# Patient Record
Sex: Male | Born: 2000 | Race: White | Hispanic: No | Marital: Single | Smoking: Never smoker
Health system: Southern US, Community
[De-identification: ages and names within clinical notes are randomized; demographics above are authoritative.]

## PROBLEM LIST (undated history)

## (undated) ENCOUNTER — Ambulatory Visit (HOSPITAL_COMMUNITY): Payer: No Payment, Other

## (undated) DIAGNOSIS — F909 Attention-deficit hyperactivity disorder, unspecified type: Secondary | ICD-10-CM

## (undated) HISTORY — PX: ORTHOPEDIC SURGERY: SHX850

---
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
## 2021-08-26 ENCOUNTER — Other Ambulatory Visit: Payer: Self-pay

## 2021-08-26 ENCOUNTER — Emergency Department (HOSPITAL_COMMUNITY)
Admission: EM | Admit: 2021-08-26 | Discharge: 2021-08-27 | Disposition: A | Discharge status: Home / Self Care | Payer: Medicaid Other | Attending: Emergency Medicine | Admitting: Emergency Medicine

## 2021-08-26 DIAGNOSIS — S61215A Laceration without foreign body of left ring finger without damage to nail, initial encounter: Secondary | ICD-10-CM

## 2021-08-26 DIAGNOSIS — W269XXA Contact with unspecified sharp object(s), initial encounter: Secondary | ICD-10-CM | POA: Insufficient documentation

## 2021-08-26 NOTE — ED Triage Notes (Signed)
Pt lacerated left middle finger while cutting cilantro about 2 pm today.  Noticed it was still oozing blood tonight and thought maybe he should come in.

## 2021-08-27 ENCOUNTER — Encounter (HOSPITAL_COMMUNITY): Payer: Self-pay | Admitting: Emergency Medicine

## 2021-08-27 NOTE — ED Provider Notes (Signed)
[facility]-EMERGENCY DEPT [facility] Emergency Department Provider Note MRN:  [id number]  Arrival date & time: 08/27/21     Chief Complaint   Laceration   History of Present Illness   Roberto Buchanan is a 21 y.o. year-old male presents to the ED with chief complaint of finger laceration.  States that he was cutting cilantro and cut his left ring finger.  He had difficulty controlling the bleeding and his mom told him to come to the ER.  History provided by patient.   Review of Systems  Pertinent review of systems noted in HPI.    Physical Exam   Vitals:   08/26/21 2357  BP: (!) 118/102  Pulse: 87  Resp: 18  Temp: 98.2 F (36.8 C)  SpO2: 100%    CONSTITUTIONAL:  well-appearing, NAD NEURO:  Alert and oriented x 3, CN 3-12 grossly intact EYES:  eyes equal and reactive ENT/NECK:  Supple, no stridor  CARDIO:  appears well-perfused  PULM:  No respiratory distress,  GI/GU:  non-distended,  MSK/SPINE:  No gross deformities, no edema, moves all extremities  SKIN:  no rash, very minor shallow laceration to left ring finger near the nail   *Additional and/or pertinent findings included in MDM below  Diagnostic and Interventional Summary    EKG Interpretation  Date/Time:    Ventricular Rate:    PR Interval:    QRS Duration:   QT Interval:    QTC Calculation:   R Axis:     Text Interpretation:         Labs Reviewed - No data to display  No orders to display    Medications - No data to display   Procedures  /  Critical Care .Marland KitchenLaceration Repair  Date/Time: 08/27/2021 1:08 AM Performed by: Roxy Horseman, PA-C Authorized by: Roxy Horseman, PA-C   Consent:    Consent obtained:  Verbal   Consent given by:  Patient   Risks discussed:  Infection, need for additional repair, pain, poor cosmetic result and poor wound healing   Alternatives discussed:  No treatment and delayed treatment Universal protocol:    Procedure explained and questions answered to  patient or proxy's satisfaction: yes     Relevant documents present and verified: yes     Test results available: yes     Imaging studies available: yes     Required blood products, implants, devices, and special equipment available: yes     Site/side marked: yes     Immediately prior to procedure, a time out was called: yes     Patient identity confirmed:  Verbally with patient Anesthesia:    Anesthesia method:  None Laceration details:    Location:  Finger   Finger location:  L ring finger   Length (cm):  1 Exploration:    Imaging outcome: foreign body not noted     Wound exploration: wound explored through full range of motion and entire depth of wound visualized     Wound extent: no tendon damage noted   Treatment:    Area cleansed with:  Saline Skin repair:    Repair method:  Tissue adhesive Approximation:    Approximation:  Close Repair type:    Repair type:  Simple Post-procedure details:    Dressing:  Open (no dressing)  ED Course and Medical Decision Making  I have reviewed the triage vital signs, the nursing notes, and pertinent available records from the EMR.  Social Determinants Affecting Complexity of Care: Patient has no clinically significant  social determinants affecting this chief complaint..   ED Course:   Patient here with finger laceration.   Medical Decision Making Problems Addressed: Laceration of left ring finger without foreign body without damage to nail, initial encounter: self-limited or minor problem     Consultants: No consultations were needed in caring for this patient.   Treatment and Plan: Emergency department workup does not suggest an emergent condition requiring admission or immediate intervention beyond  what has been performed at this time. The patient is safe for discharge and has  been instructed to return immediately for worsening symptoms, change in  symptoms or any other concerns    Final Clinical Impressions(s) / ED  Diagnoses     ICD-10-CM   1. Laceration of left ring finger without foreign body without damage to nail, initial encounter  E26.834H       ED Discharge Orders     None         Discharge Instructions Discussed with and Provided to Patient:   Discharge Instructions   None      Roxy Horseman, PA-C 08/27/21 0109    Geoffery Lyons, MD 08/27/21 [phone]

## 2021-12-27 ENCOUNTER — Encounter (HOSPITAL_BASED_OUTPATIENT_CLINIC_OR_DEPARTMENT_OTHER): Payer: Self-pay | Admitting: Emergency Medicine

## 2021-12-27 ENCOUNTER — Emergency Department (HOSPITAL_BASED_OUTPATIENT_CLINIC_OR_DEPARTMENT_OTHER)
Admission: EM | Admit: 2021-12-27 | Discharge: 2021-12-27 | Disposition: A | Discharge status: Home / Self Care | Payer: Self-pay | Attending: Emergency Medicine | Admitting: Emergency Medicine

## 2021-12-27 ENCOUNTER — Other Ambulatory Visit: Payer: Self-pay

## 2021-12-27 DIAGNOSIS — J069 Acute upper respiratory infection, unspecified: Secondary | ICD-10-CM | POA: Insufficient documentation

## 2021-12-27 DIAGNOSIS — Z20822 Contact with and (suspected) exposure to covid-19: Secondary | ICD-10-CM | POA: Insufficient documentation

## 2021-12-27 LAB — GROUP A STREP BY PCR: Group A Strep by PCR: NOT DETECTED

## 2021-12-27 LAB — RESP PANEL BY RT-PCR (FLU A&B, COVID) ARPGX2
Influenza A by PCR: NEGATIVE
Influenza B by PCR: NEGATIVE
SARS Coronavirus 2 by RT PCR: NEGATIVE

## 2021-12-27 NOTE — ED Provider Notes (Signed)
[place] [facility] EMERGENCY DEPT Provider Note   CSN: [id number] Arrival date & time: 12/27/21  1907     History  Chief Complaint  Patient presents with   Nasal Congestion    Roberto Buchanan is a 21 y.o. male.  21 year old male presents today for evaluation of URI symptoms.  He siblings also present today.  All with similar symptoms.  Patient is overall well-appearing.  Tolerating p.o. intake without difficulty.  Denies fever.  The history is provided by the patient. No language interpreter was used.       Home Medications Prior to Admission medications   Medication Sig Start Date End Date Taking? Authorizing Provider  cloNIDine (CATAPRES) 0.1 MG tablet Take 0.1 mg by mouth at bedtime as needed (sleep).     [provider]  hydrocortisone 2.5 % cream Apply topically 3 (three) times daily. 08/24/17   Kristen Cardinal, NP  ibuprofen (ADVIL,MOTRIN) 600 MG tablet Take 1 tablet (600 mg total) every 6 (six) hours as needed by mouth for mild pain. 02/18/17   Adibe, Dannielle Huh, MD  lisdexamfetamine (VYVANSE) 40 MG capsule Take 40 mg by mouth every morning.     [provider]  naproxen (NAPROSYN) 500 MG tablet Take 1 tablet (500 mg total) by mouth 2 (two) times daily. 07/27/18   Louanne Skye, MD  Olopatadine HCl (PATADAY) 0.2 % SOLN Place 1 drop into both eyes daily as needed (allergies).     [provider]      Allergies    Other    Review of Systems   Review of Systems  Constitutional:  Negative for chills and fever.  HENT:  Positive for sore throat. Negative for congestion and rhinorrhea.   Respiratory:  Negative for shortness of breath.   Cardiovascular:  Negative for chest pain.  Neurological:  Negative for headaches.  All other systems reviewed and are negative.   Physical Exam Updated Vital Signs BP (!) 138/96 (BP Location: Right Arm)   Pulse 83   Temp (!) 97.5 F (36.4 C)   Resp 16   Ht 6\' 4"  (1.93 m)   Wt 95.3 kg   SpO2 99%    BMI 25.56 kg/m  Physical Exam Vitals and nursing note reviewed.  Constitutional:      General: He is not in acute distress.    Appearance: Normal appearance. He is not ill-appearing.  HENT:     Head: Normocephalic and atraumatic.     Right Ear: Tympanic membrane, ear canal and external ear normal. There is no impacted cerumen.     Left Ear: Tympanic membrane, ear canal and external ear normal. There is no impacted cerumen.     Nose: Nose normal.     Mouth/Throat:     Mouth: Mucous membranes are moist.     Pharynx: No posterior oropharyngeal erythema.  Eyes:     Extraocular Movements: Extraocular movements intact.     Conjunctiva/sclera: Conjunctivae normal.  Cardiovascular:     Rate and Rhythm: Normal rate and regular rhythm.  Pulmonary:     Effort: Pulmonary effort is normal. No respiratory distress.     Breath sounds: Normal breath sounds. No wheezing or rales.  Musculoskeletal:        General: No deformity. Normal range of motion.     Cervical back: Normal range of motion.  Skin:    Findings: No rash.  Neurological:     Mental Status: He is alert.     ED Results / Procedures /  Treatments   Labs (all labs ordered are listed, but only abnormal results are displayed) Labs Reviewed  RESP PANEL BY RT-PCR (FLU A&B, COVID) ARPGX2  GROUP A STREP BY PCR    EKG None  Radiology No results found.  Procedures Procedures    Medications Ordered in ED Medications - No data to display  ED Course/ Medical Decision Making/ A&P                           Medical Decision Making  21 year old male presents today with above-mentioned complaints.  Siblings with similar symptoms.  Who came into the emergency room for evaluation tonight.  Overall well-appearing.  COVID, flu, strep negative.  Likely viral upper respiratory infection.  Discussed symptomatic management including adequate hydration.   Final Clinical Impression(s) / ED Diagnoses Final diagnoses:  Viral URI with  cough    Rx / DC Orders ED Discharge Orders     None         Evlyn Courier, PA-C 12/27/21 2200    Ezequiel Essex, MD 12/27/21 2354

## 2021-12-27 NOTE — ED Triage Notes (Signed)
Pt c/o runny nose and sore throat since Sunday

## 2021-12-27 NOTE — Discharge Instructions (Signed)
Your exam today is overall reassuring.  Your COVID, flu, and strep were negative.  Continue to drink plenty of fluids.  Take Tylenol Motrin as you need to for fever control.  For any concerning symptoms return to the emergency room otherwise follow-up with your PCP.

## 2021-12-27 NOTE — ED Notes (Signed)
RN provided AVS using Teachback Method. Patient verbalizes understanding of Discharge Instructions. Opportunity for Questioning and Answers were provided by RN. Patient Discharged from ED ambulatory to home with Family.  

## 2022-01-22 ENCOUNTER — Other Ambulatory Visit: Payer: Self-pay

## 2022-01-22 ENCOUNTER — Emergency Department (HOSPITAL_BASED_OUTPATIENT_CLINIC_OR_DEPARTMENT_OTHER)
Admission: EM | Admit: 2022-01-22 | Discharge: 2022-01-22 | Disposition: A | Discharge status: Home / Self Care | Payer: Self-pay | Attending: Emergency Medicine | Admitting: Emergency Medicine

## 2022-01-22 ENCOUNTER — Encounter (HOSPITAL_BASED_OUTPATIENT_CLINIC_OR_DEPARTMENT_OTHER): Payer: Self-pay

## 2022-01-22 DIAGNOSIS — J020 Streptococcal pharyngitis: Secondary | ICD-10-CM | POA: Insufficient documentation

## 2022-01-22 DIAGNOSIS — Z1152 Encounter for screening for COVID-19: Secondary | ICD-10-CM | POA: Insufficient documentation

## 2022-01-22 DIAGNOSIS — Z79899 Other long term (current) drug therapy: Secondary | ICD-10-CM | POA: Insufficient documentation

## 2022-01-22 LAB — MONONUCLEOSIS SCREEN: Mono Screen: NEGATIVE

## 2022-01-22 LAB — RESP PANEL BY RT-PCR (FLU A&B, COVID) ARPGX2
Influenza A by PCR: NEGATIVE
Influenza B by PCR: NEGATIVE
SARS Coronavirus 2 by RT PCR: NEGATIVE

## 2022-01-22 LAB — GROUP A STREP BY PCR: Group A Strep by PCR: DETECTED — AB

## 2022-01-22 MED ORDER — PENICILLIN G BENZATHINE 1200000 UNIT/2ML IM SUSY
2.4000 10*6.[IU] | PREFILLED_SYRINGE | Freq: Once | INTRAMUSCULAR | Status: AC
  Administered 2022-01-22: 2.4 10*6.[IU] via INTRAMUSCULAR
  Filled 2022-01-22: qty 4

## 2022-01-22 NOTE — ED Provider Notes (Signed)
[facility] EMERGENCY DEPT Provider Note   CSN: [id number] Arrival date & time: 01/22/22  1910     History  Chief Complaint  Patient presents with   Sore Throat    Roberto Buchanan is a 21 y.o. male.  ray  The history is provided by the patient. No language interpreter was used.  Sore Throat This is a recurrent problem. The current episode started 2 days ago. The problem occurs constantly. The problem has been gradually worsening. Pertinent negatives include no chest pain. Nothing aggravates the symptoms. Nothing relieves the symptoms. He has tried nothing for the symptoms. The treatment provided no relief.       Home Medications Prior to Admission medications   Medication Sig Start Date End Date Taking? Authorizing Provider  cloNIDine (CATAPRES) 0.1 MG tablet Take 0.1 mg by mouth at bedtime as needed (sleep).     [provider]  hydrocortisone 2.5 % cream Apply topically 3 (three) times daily. 08/24/17   Kristen Cardinal, NP  ibuprofen (ADVIL,MOTRIN) 600 MG tablet Take 1 tablet (600 mg total) every 6 (six) hours as needed by mouth for mild pain. 02/18/17   Adibe, Dannielle Huh, MD  lisdexamfetamine (VYVANSE) 40 MG capsule Take 40 mg by mouth every morning.     [provider]  naproxen (NAPROSYN) 500 MG tablet Take 1 tablet (500 mg total) by mouth 2 (two) times daily. 07/27/18   Louanne Skye, MD  Olopatadine HCl (PATADAY) 0.2 % SOLN Place 1 drop into both eyes daily as needed (allergies).     [provider]      Allergies    Other    Review of Systems   Review of Systems  Cardiovascular:  Negative for chest pain.  All other systems reviewed and are negative.   Physical Exam Updated Vital Signs BP (!) 133/92 (BP Location: Right Arm)   Pulse 66   Temp 98.3 F (36.8 C) (Oral)   Resp 16   Ht 6\' 4"  (1.93 m)   Wt 95.3 kg   SpO2 99%   BMI 25.57 kg/m  Physical Exam Vitals and nursing note reviewed.  Constitutional:       Appearance: He is well-developed.  HENT:     Head: Normocephalic.     Mouth/Throat:     Pharynx: Pharyngeal swelling and posterior oropharyngeal erythema present.     Tonsils: 2+ on the right. 2+ on the left.  Eyes:     Conjunctiva/sclera: Conjunctivae normal.  Pulmonary:     Effort: Pulmonary effort is normal.  Abdominal:     General: There is no distension.  Musculoskeletal:        General: Normal range of motion.     Cervical back: Normal range of motion.  Neurological:     Mental Status: He is alert and oriented to person, place, and time.     ED Results / Procedures / Treatments   Labs (all labs ordered are listed, but only abnormal results are displayed) Labs Reviewed  GROUP A STREP BY PCR - Abnormal; Notable for the following components:      Result Value   Group A Strep by PCR DETECTED (*)    All other components within normal limits  RESP PANEL BY RT-PCR (FLU A&B, COVID) ARPGX2  MONONUCLEOSIS SCREEN    EKG None  Radiology No results found.  Procedures Procedures    Medications Ordered in ED Medications  penicillin g benzathine (BICILLIN LA) 1200000 UNIT/2ML injection 2.4 Million Units (2.4 Million  Units Intramuscular Given 01/22/22 2243)    ED Course/ Medical Decision Making/ A&P                           Medical Decision Making Pt complains of a sore throat.   Amount and/or Complexity of Data Reviewed Labs: ordered. Decision-making details documented in ED Course.    Details: Labs ordered reviewed and interpreted,  Strep is positive   Risk Prescription drug management. Risk Details: Pt request treatment with injection of pcn.             Final Clinical Impression(s) / ED Diagnoses Final diagnoses:  Strep pharyngitis    Rx / DC Orders ED Discharge Orders     None     An After Visit Summary was printed and given to the patient.     Elson Areas, PA-C 01/22/22 2322    Franne Forts, DO 01/24/22 1831

## 2022-01-22 NOTE — ED Triage Notes (Signed)
Patient here POV from Home  Endorses Sore Throat for approximately a few days. Some Cough noted.  NAD Noted during Triage. A&Ox4. GCS 15. Ambulatory.

## 2022-03-19 ENCOUNTER — Other Ambulatory Visit: Payer: Self-pay

## 2022-03-19 ENCOUNTER — Emergency Department (HOSPITAL_BASED_OUTPATIENT_CLINIC_OR_DEPARTMENT_OTHER)
Admission: EM | Admit: 2022-03-19 | Discharge: 2022-03-20 | Disposition: A | Discharge status: Other Acute Inpatient Hospital | Payer: Self-pay | Attending: Emergency Medicine | Admitting: Emergency Medicine

## 2022-03-19 ENCOUNTER — Encounter (HOSPITAL_BASED_OUTPATIENT_CLINIC_OR_DEPARTMENT_OTHER): Payer: Self-pay | Admitting: Emergency Medicine

## 2022-03-19 DIAGNOSIS — T1491XA Suicide attempt, initial encounter: Secondary | ICD-10-CM

## 2022-03-19 DIAGNOSIS — T50902A Poisoning by unspecified drugs, medicaments and biological substances, intentional self-harm, initial encounter: Secondary | ICD-10-CM | POA: Insufficient documentation

## 2022-03-19 DIAGNOSIS — Z1152 Encounter for screening for COVID-19: Secondary | ICD-10-CM | POA: Insufficient documentation

## 2022-03-19 LAB — CBC WITH DIFFERENTIAL/PLATELET
Abs Immature Granulocytes: 0.01 10*3/uL (ref 0.00–0.07)
Basophils Absolute: 0 10*3/uL (ref 0.0–0.1)
Basophils Relative: 0 %
Eosinophils Absolute: 0.1 10*3/uL (ref 0.0–0.5)
Eosinophils Relative: 3 %
HCT: 47.1 % (ref 39.0–52.0)
Hemoglobin: 16.3 g/dL (ref 13.0–17.0)
Immature Granulocytes: 0 %
Lymphocytes Relative: 30 %
Lymphs Abs: 1.5 10*3/uL (ref 0.7–4.0)
MCH: 29.7 pg (ref 26.0–34.0)
MCHC: 34.6 g/dL (ref 30.0–36.0)
MCV: 85.9 fL (ref 80.0–100.0)
Monocytes Absolute: 0.7 10*3/uL (ref 0.1–1.0)
Monocytes Relative: 14 %
Neutro Abs: 2.5 10*3/uL (ref 1.7–7.7)
Neutrophils Relative %: 53 %
Platelets: 232 10*3/uL (ref 150–400)
RBC: 5.48 MIL/uL (ref 4.22–5.81)
RDW: 13.1 % (ref 11.5–15.5)
WBC: 4.9 10*3/uL (ref 4.0–10.5)
nRBC: 0 % (ref 0.0–0.2)

## 2022-03-19 LAB — RESP PANEL BY RT-PCR (FLU A&B, COVID) ARPGX2
Influenza A by PCR: NEGATIVE
Influenza B by PCR: NEGATIVE
SARS Coronavirus 2 by RT PCR: NEGATIVE

## 2022-03-19 LAB — RAPID URINE DRUG SCREEN, HOSP PERFORMED
Amphetamines: NOT DETECTED
Barbiturates: NOT DETECTED
Benzodiazepines: NOT DETECTED
Cocaine: NOT DETECTED
Opiates: NOT DETECTED
Tetrahydrocannabinol: POSITIVE — AB

## 2022-03-19 LAB — ACETAMINOPHEN LEVEL: Acetaminophen (Tylenol), Serum: <10 ug/mL — ABNORMAL LOW (ref 10–30)

## 2022-03-19 LAB — COMPREHENSIVE METABOLIC PANEL
ALT: 13 U/L (ref 0–44)
AST: 20 U/L (ref 15–41)
Albumin: 4.3 g/dL (ref 3.5–5.0)
Alkaline Phosphatase: 62 U/L (ref 38–126)
Anion gap: 6 (ref 5–15)
BUN: 8 mg/dL (ref 6–20)
CO2: 26 mmol/L (ref 22–32)
Calcium: 9.1 mg/dL (ref 8.9–10.3)
Chloride: 105 mmol/L (ref 98–111)
Creatinine, Ser: 1.02 mg/dL (ref 0.61–1.24)
GFR, Estimated: >60 mL/min (ref >60)
Glucose, Bld: 101 mg/dL — ABNORMAL HIGH (ref 70–99)
Potassium: 3.9 mmol/L (ref 3.5–5.1)
Sodium: 137 mmol/L (ref 135–145)
Total Bilirubin: 0.6 mg/dL (ref 0.3–1.2)
Total Protein: 8.2 g/dL — ABNORMAL HIGH (ref 6.5–8.1)

## 2022-03-19 LAB — ETHANOL: Alcohol, Ethyl (B): <10 mg/dL (ref <10)

## 2022-03-19 LAB — SALICYLATE LEVEL: Salicylate Lvl: <7 mg/dL — ABNORMAL LOW (ref 7.0–30.0)

## 2022-03-19 NOTE — ED Notes (Signed)
Security took patient's belonging to safe : black iphone , silver watch , brown wallet zero cash ,silver ring and keys . Pt's clothes and shoes are in the cabinet above the nurse's station.

## 2022-03-19 NOTE — ED Triage Notes (Signed)
Pt arrives pov, steady gait, endorses SI, reports taking "bunch of pills that were in my car last night", denies knowing type of pills. Reports emesis multiple times. Pt has cut marks on LT arm, endorses being a "cutter". Pt with calm demeanor, cooperative

## 2022-03-19 NOTE — ED Provider Notes (Signed)
[facility] EMERGENCY DEPARTMENT Provider Note  CSN: [id number] Arrival date & time: 03/19/22 1757  Chief Complaint(s) Suicidal  HPI Roberto Buchanan is a 21 y.o. male who denies significant past medical history presenting to the emergency department with suicidal ideation.  He reports last night he felt suicidal and took unidentified pills that were in his car.  He is not sure what they are.  He reports may be some oxycodone and may be some Tylenol.  He reports that he felt woozy last night but now feels at baseline.  Denies any abdominal pain, nausea, vomiting, diarrhea, lightheadedness, dizziness, palpitations, chest pain, shortness of breath.  He reports that he does not still feel suicidal the extent of wanting to hurt himself but does feel depressed.  Has never done this before.  No prior psychiatric hospitalization.  Denies any drug or alcohol use.  Denies any homicidal ideation or hallucinations.  Past Medical History Past Medical History:  Diagnosis Date   Attention deficit hyperactivity disorder    Patient Active Problem List   Diagnosis Date Noted   Abdominal pain 02/17/2017   Home Medication(s) Prior to Admission medications   Medication Sig Start Date End Date Taking? Authorizing Provider  cloNIDine (CATAPRES) 0.1 MG tablet Take 0.1 mg by mouth at bedtime as needed (sleep).     [provider]  hydrocortisone 2.5 % cream Apply topically 3 (three) times daily. 08/24/17   Lowanda Foster, NP  ibuprofen (ADVIL,MOTRIN) 600 MG tablet Take 1 tablet (600 mg total) every 6 (six) hours as needed by mouth for mild pain. 02/18/17   Adibe, Felix Pacini, MD  lisdexamfetamine (VYVANSE) 40 MG capsule Take 40 mg by mouth every morning.     [provider]  naproxen (NAPROSYN) 500 MG tablet Take 1 tablet (500 mg total) by mouth 2 (two) times daily. 07/27/18   Niel Hummer, MD  Olopatadine HCl (PATADAY) 0.2 % SOLN Place 1 drop into both eyes daily as needed (allergies).      [provider]                                                                                                                                    Past Surgical History Past Surgical History:  Procedure Laterality Date   LAPAROSCOPIC APPENDECTOMY N/A 02/17/2017   Procedure: APPENDECTOMY LAPAROSCOPIC;  Surgeon: Kandice Hams, MD;  Location: [facility] OR;  Service: Pediatrics;  Laterality: N/A;   ORTHOPEDIC SURGERY     On Elbow    Family History Family History  Problem Relation Age of Onset   Heart disease Maternal Aunt    Drug abuse Maternal Aunt    Drug abuse Maternal Uncle     Social History Social History   Tobacco Use   Smoking status: Never   Smokeless tobacco: Never  Vaping Use   Vaping Use: Every day  Substance Use Topics   Alcohol use: Yes  Comment: occ   Allergies Other  Review of Systems Review of Systems  All other systems reviewed and are negative.   Physical Exam Vital Signs  I have reviewed the triage vital signs BP 116/76 (BP Location: Left Arm)   Pulse 73   Temp 98.6 F (37 C) (Oral)   Resp 16   Ht 6\' 3"  (1.905 m)   Wt 104.3 kg   SpO2 99%   BMI 28.75 kg/m  Physical Exam Vitals and nursing note reviewed.  Constitutional:      General: He is not in acute distress.    Appearance: Normal appearance.  HENT:     Mouth/Throat:     Mouth: Mucous membranes are moist.  Eyes:     Conjunctiva/sclera: Conjunctivae normal.  Cardiovascular:     Rate and Rhythm: Normal rate and regular rhythm.  Pulmonary:     Effort: Pulmonary effort is normal. No respiratory distress.     Breath sounds: Normal breath sounds.  Abdominal:     General: Abdomen is flat.     Palpations: Abdomen is soft.     Tenderness: There is no abdominal tenderness.  Musculoskeletal:     Right lower leg: No edema.     Left lower leg: No edema.  Skin:    General: Skin is warm and dry.     Capillary Refill: Capillary refill takes less than 2 seconds.     Comments:  Superficial healing cuts to both forearms  Neurological:     Mental Status: He is alert and oriented to person, place, and time. Mental status is at baseline.  Psychiatric:        Behavior: Behavior normal.     Comments: Dysphoric mood     ED Results and Treatments Labs (all labs ordered are listed, but only abnormal results are displayed) Labs Reviewed  COMPREHENSIVE METABOLIC PANEL - Abnormal; Notable for the following components:      Result Value   Glucose, Bld 101 (*)    Total Protein 8.2 (*)    All other components within normal limits  RAPID URINE DRUG SCREEN, HOSP PERFORMED - Abnormal; Notable for the following components:   Tetrahydrocannabinol POSITIVE (*)    All other components within normal limits  ACETAMINOPHEN LEVEL - Abnormal; Notable for the following components:   Acetaminophen (Tylenol), Serum <10 (*)    All other components within normal limits  SALICYLATE LEVEL - Abnormal; Notable for the following components:   Salicylate Lvl <7.0 (*)    All other components within normal limits  RESP PANEL BY RT-PCR (FLU A&B, COVID) ARPGX2  ETHANOL  CBC WITH DIFFERENTIAL/PLATELET                                                                                                                          Radiology No results found.  Pertinent labs & imaging results that were available during my care of the patient were reviewed by me and considered in my medical decision  making (see MDM for details).  Medications Ordered in ED Medications - No data to display                                                                                                                                   Procedures Procedures  (including critical care time)  Medical Decision Making / ED Course   MDM:  21 year old male after attempted overdose.  Patient well-appearing, physical exam unremarkable.  Although patient reports no active SI will consult psychiatry after patient medically  cleared given his attempt last night.  Low concern for dangerous result of his ingestion given that he is currently asymptomatic.  Will obtain labs including LFTs, Tylenol and salicylate level, ethanol level.  Will obtain EKG.  If medical workup unremarkable will place TTS consult  Clinical Course as of 03/19/22 2310  Mon Mar 19, 2022  2308 Psychiatry team recommends inpatient admission.  I placed patient on an involuntary commitment as he wanted to leave.  Since he attempted suicide last night I feel he is high risk to have another suicide attempt. [WS]    Clinical Course User Index [WS] Lonell GrandchildScheving, Roma Bondar L, MD       Additional history obtained: -External records from outside source obtained and reviewed including: Chart review including previous notes, labs, imaging, consultation notes including ED visit 01/22/22   Lab Tests: -I ordered, reviewed, and interpreted labs.   The pertinent results include:   Labs Reviewed  COMPREHENSIVE METABOLIC PANEL - Abnormal; Notable for the following components:      Result Value   Glucose, Bld 101 (*)    Total Protein 8.2 (*)    All other components within normal limits  RAPID URINE DRUG SCREEN, HOSP PERFORMED - Abnormal; Notable for the following components:   Tetrahydrocannabinol POSITIVE (*)    All other components within normal limits  ACETAMINOPHEN LEVEL - Abnormal; Notable for the following components:   Acetaminophen (Tylenol), Serum <10 (*)    All other components within normal limits  SALICYLATE LEVEL - Abnormal; Notable for the following components:   Salicylate Lvl <7.0 (*)    All other components within normal limits  RESP PANEL BY RT-PCR (FLU A&B, COVID) ARPGX2  ETHANOL  CBC WITH DIFFERENTIAL/PLATELET    Notable for positive THC. Negative tylenol/salicylate levels  EKG   EKG Interpretation  Date/Time:  Monday March 19 2022 19:29:33 EST Ventricular Rate:  70 PR Interval:  124 QRS Duration: 106 QT Interval:  394 QTC  Calculation: 426 R Axis:   79 Text Interpretation: Sinus rhythm RSR' in V1 or V2, probably normal variant Confirmed by Alvino BloodScheving, Newell Wafer ([id number]) on 03/19/2022 7:32:07 PM         Medicines ordered and prescription drug management: No orders of the defined types were placed in this encounter.   -I have reviewed the patients home medicines and have made adjustments as needed   Consultations Obtained: I requested consultation  with the psychiatrist,  and discussed lab and imaging findings as well as pertinent plan - they recommend: inpatient psychiatric admission   Cardiac Monitoring: The patient was maintained on a cardiac monitor.  I personally viewed and interpreted the cardiac monitored which showed an underlying rhythm of: NSR  Co morbidities that complicate the patient evaluation  Past Medical History:  Diagnosis Date   Attention deficit hyperactivity disorder       Dispostion: Disposition decision including need for hospitalization was considered, and patient will be psychiatric boarder for inpatient psychiatric hospitalization    Final Clinical Impression(s) / ED Diagnoses Final diagnoses:  Suicide attempt [facility])     This chart was dictated using voice recognition software.  Despite best efforts to proofread,  errors can occur which can change the documentation meaning.    Lonell Grandchild, MD 03/19/22 [phone]

## 2022-03-19 NOTE — ED Notes (Signed)
Roberto Buchanan [phone] Mother [id number] Roberto Buchanan [phone]

## 2022-03-19 NOTE — BH Assessment (Signed)
Comprehensive Clinical Assessment (CCA) Note  03/19/2022 Roberto Buchanan [id number]  Disposition: Roberto Hires, NP recommends inpatient psychiatric hospitalization due to suicide attempt. Dr. Alvino Buchanan made aware of recommendation. [facility] AC to review.  The patient demonstrates the following risk factors for suicide: Chronic risk factors for suicide include: N/A. Acute risk factors for suicide include:  financial stressors . Protective factors for this patient include: positive social support. Considering these factors, the overall suicide risk at this point appears to be high. Patient is not appropriate for outpatient follow up.  Flowsheet Row ED from 03/19/2022 in [facility] from 01/22/2022 in [facility] ED from 12/27/2021 in [facility]  C-SSRS RISK CATEGORY High Risk No Risk No Risk      Roberto Buchanan is a 21 year-old male who presents voluntarily to [employer] via personal vehicle. Pt reports "I took a bunch of medication last night." Pt also showed clinician superficial cuts to his left forearm. When asked what triggered the suicide attempt, Pt states "I guess I was just thinking too much. They say your 20's are the most stressful years of your life." Pt eventually expressed he may have been sad about not seeing his 2 y.o. daughter. Pt reports he does not know what medication or the amount of medication he took. He says he found the pills in an unlabeled bottle, in his trunk. Pt states after taking the pills, he "snapped out of it" and went to his girlfriends house where they talked for three hours. Pt says talking with his girlfriend helped him feel better. Pt denies any symptoms of depression and reports he is not currently suicidal. Pt states he has never had suicidal thoughts or attempts in the past. He also denies any previous self-harming behavior. Pt denies any history of HI,  auditory or visual hallucinations. Pt denies any current substance use, however he reports marijuana use about three months ago.   Pt identifies finances as being his primary stressor. Aside from finances, he states "regular adult stuff." Pt is currently employed with [employer] and works 10p-5a. Pt denies any works stressors and expresses work is helpful to his mental health. Pt says he does not currently have a stable home, stating he lives between his girlfriends home and friends homes. Pt reports he has been "house hopping" for two or three months. Pt identifies his girlfriend as his primary support and states he believes he has a good support system. Pt denies any traumas or history of abuse. Pt denies any current legal involvement, however states he may have an upcoming child support court date.  Pt reports he currently does not take any medications. He states the only medication he has ever been prescribed is for ADHD, which he stopped taking at age 33 or 17. Pt denies any previous psychiatric hospitalizations.  Pt is dressed casually, alert and oriented x4. Pt has normal speech and good eye contact. Pt's mood is euthymic and thought process is coherent. Pt's insight into his mental health is lacking, as he minimizes the suicide attempt. There is no indication that Pt is responding to internal stimuli. Pt is cooperative throughout assessment. Pt states he does not believe he would benefit from therapeutic resources and expresses he does not talk to strangers. Pt reports he feels safe to return to his girlfriends home.   Chief Complaint:  Chief Complaint  Patient presents with   Suicidal   Visit Diagnosis: Suicidal  CCA Screening, Triage and Referral (STR)  Patient Reported Information How did you hear about [place]? Self  What Is the Reason for Your Visit/Call Today? Pt stated "I took a bunch of medications last night."  How Long Has This Been Causing You Problems? <Week  What Do You Feel  Would Help You the Most Today? Treatment for Depression or other mood problem   Have You Recently Had Any Thoughts About Hurting Yourself? Yes  Are You Planning to Commit Suicide/Harm Yourself At This time? No   Flowsheet Row ED from 03/19/2022 in [facility] from 01/22/2022 in [facility] ED from 12/27/2021 in [facility]  C-SSRS RISK CATEGORY High Risk No Risk No Risk       Have you Recently Had Thoughts About Hurting Someone Roberto Buchanan? No  Are You Planning to Harm Someone at This Time? No  Explanation: N/A   Have You Used Any Alcohol or Drugs in the Past 24 Hours? No  What Did You Use and How Much? N/A   Do You Currently Have a Therapist/Psychiatrist? No  Name of Therapist/Psychiatrist: Name of Therapist/Psychiatrist: N/A   Have You Been Recently Discharged From Any Office Practice or Programs? No  Explanation of Discharge From Practice/Program: N/A     CCA Screening Triage Referral Assessment Type of Contact: Tele-Assessment  Telemedicine Service Delivery: Telemedicine service delivery: This service was provided via telemedicine using a 2-way, interactive audio and video technology  Is this Initial or Reassessment? Is this Initial or Reassessment?: Initial Assessment  Date Telepsych consult ordered in [facility]:  Date Telepsych consult ordered in [facility]: 03/19/22  Time Telepsych consult ordered in [facility]:  Time Telepsych consult ordered in [facility]: 2011  Location of Assessment: [facility]  Provider Location: [facility] Assessment Services   Collateral Involvement: N/A   Does Patient Have a Automotive engineer Guardian? No  Legal Guardian Contact Information: N/A  Copy of Legal Guardianship Form: -- (N/A)  Legal Guardian Notified of Arrival: -- (N/A)  Legal Guardian Notified of Pending Discharge: -- (N/A)  If Minor and Not Living with Parent(s), Who has Custody? N/A  Is  CPS involved or ever been involved? Never  Is APS involved or ever been involved? Never   Patient Determined To Be At Risk for Harm To Self or Others Based on Review of Patient Reported Information or Presenting Complaint? No  Method: -- (N/A)  Availability of Means: -- (N/A)  Intent: -- (N/A)  Notification Required: No need or identified person  Additional Information for Danger to Others Potential: -- (N/A)  Additional Comments for Danger to Others Potential: N/A  Are There Guns or Other Weapons in Your Home? No  Types of Guns/Weapons: N/A  Are These Weapons Safely Secured?                            -- (N/A)  Who Could Verify You Are Able To Have These Secured: N/A  Do You Have any Outstanding Charges, Pending Court Dates, Parole/Probation? Pt reports there may be an upcoming court date for child support.  Contacted To Inform of Risk of Harm To Self or Others: -- (N/A)    Does Patient Present under Involuntary Commitment? No    [place] of Residence: [place]   Patient Currently Receiving the Following Services: Not Receiving Services   Determination of Need: Emergent (2 hours)   Options For Referral: Inpatient Hospitalization; Outpatient  Therapy     CCA Biopsychosocial Patient Reported Schizophrenia/Schizoaffective Diagnosis in Past: No   Strengths: Pt unable to identify strengths   Mental Health Symptoms Depression:   Tearfulness (Pt states he randomly cries)   Duration of Depressive symptoms:  Duration of Depressive Symptoms: Less than two weeks   Mania:   None   Anxiety:    None   Psychosis:   None   Duration of Psychotic symptoms:    Trauma:   None   Obsessions:   None   Compulsions:   None   Inattention:   None   Hyperactivity/Impulsivity:   None   Oppositional/Defiant Behaviors:   None   Emotional Irregularity:   None   Other Mood/Personality Symptoms:   N/A    Mental Status Exam Appearance and self-care   Stature:   Tall   Weight:   Average weight   Clothing:   Casual   Grooming:   Normal   Cosmetic use:   None   Posture/gait:   Normal   Motor activity:   Not Remarkable   Sensorium  Attention:   Normal   Concentration:   Normal   Orientation:   X5   Recall/memory:   Normal   Affect and Mood  Affect:   Full Range   Mood:   Euthymic   Relating  Eye contact:   Normal   Facial expression:   Responsive   Attitude toward examiner:   Cooperative   Thought and Language  Speech flow:  Normal   Thought content:   Appropriate to Mood and Circumstances   Preoccupation:   None   Hallucinations:   None   Organization:   Linear   Company secretaryxecutive Functions  Fund of Knowledge:   Average   Intelligence:   Average   Abstraction:   Normal   Judgement:   Fair   Dance movement psychotherapisteality Testing:   Adequate   Insight:   Lacking   Decision Making:   Impulsive   Social Functioning  Social Maturity:   Impulsive   Social Judgement:   Normal   Stress  Stressors:   Office managerinancial   Coping Ability:   Human resources officerverwhelmed   Skill Deficits:   None   Supports:   Family     Religion: Religion/Spirituality Are You A Religious Person?: Yes What is Your Religious Affiliation?: Christian How Might This Affect Treatment?: N/A  Leisure/Recreation: Leisure / Recreation Do You Have Hobbies?: Yes Leisure and Hobbies: Drawing & basketball  Exercise/Diet: Exercise/Diet Do You Exercise?: Yes What Type of Exercise Do You Do?: Weight Training, Run/Walk How Many Times a Week Do You Exercise?: Daily Have You Gained or Lost A Significant Amount of Weight in the Past Six Months?: No Do You Follow a Special Diet?: No Do You Have Any Trouble Sleeping?: No   CCA Employment/Education Employment/Work Situation: Employment / Work Situation Employment Situation: Employed Work Stressors: Pt reports no work stressors Patient's Job has Been Impacted by Current Illness: No Has  Patient ever Been in Equities traderthe Military?: No  Education: Education Is Patient Currently Attending School?: No Last Grade Completed: 12 Did You Product managerAttend College?: No Did You Have An Individualized Education Program (IIEP): No Did You Have Any Difficulty At Progress EnergySchool?: No Patient's Education Has Been Impacted by Current Illness: No   CCA Family/Childhood History Family and Relationship History: Family history Marital status: Long term relationship Long term relationship, how long?: Unknown What types of issues is patient dealing with in the relationship?: N/A Additional relationship information: N/A  Does patient have children?: Yes How many children?: 1 How is patient's relationship with their children?: Pt reports he signed custody over to the mother  Childhood History:  Childhood History By whom was/is the patient raised?: Mother Did patient suffer any verbal/emotional/physical/sexual abuse as a child?: No Did patient suffer from severe childhood neglect?: No Has patient ever been sexually abused/assaulted/raped as an adolescent or adult?: No Was the patient ever a victim of a crime or a disaster?: No Witnessed domestic violence?: No Has patient been affected by domestic violence as an adult?: No       CCA Substance Use Alcohol/Drug Use: Alcohol / Drug Use Pain Medications: See MAR Prescriptions: See MAR Over the Counter: See Mar History of alcohol / drug use?: No history of alcohol / drug abuse Longest period of sobriety (when/how long): N/A Negative Consequences of Use:  (N/A) Withdrawal Symptoms:  (N/A)                         ASAM's:  Six Dimensions of Multidimensional Assessment  Dimension 1:  Acute Intoxication and/or Withdrawal Potential:      Dimension 2:  Biomedical Conditions and Complications:      Dimension 3:  Emotional, Behavioral, or Cognitive Conditions and Complications:     Dimension 4:  Readiness to Change:     Dimension 5:  Relapse,  Continued use, or Continued Problem Potential:     Dimension 6:  Recovery/Living Environment:     ASAM Severity Score:    ASAM Recommended Level of Treatment:     Substance use Disorder (SUD)    Recommendations for Services/Supports/Treatments:    Discharge Disposition:    DSM5 Diagnoses: Patient Active Problem List   Diagnosis Date Noted   Abdominal pain 02/17/2017     Referrals to Alternative Service(s): Referred to Alternative Service(s):   Place:   Date:   Time:    Referred to Alternative Service(s):   Place:   Date:   Time:    Referred to Alternative Service(s):   Place:   Date:   Time:    Referred to Alternative Service(s):   Place:   Date:   Time:     Cleda Clarks, Theresia Majors

## 2022-03-20 ENCOUNTER — Inpatient Hospital Stay (HOSPITAL_COMMUNITY)
Admission: AD | Admit: 2022-03-20 | Discharge: 2022-03-22 | DRG: 914 | Disposition: A | Discharge status: Home / Self Care | Payer: Federal, State, Local not specified - Other | Attending: Psychiatry | Admitting: Psychiatry

## 2022-03-20 ENCOUNTER — Encounter (HOSPITAL_COMMUNITY): Payer: Self-pay | Admitting: Psychiatry

## 2022-03-20 DIAGNOSIS — Z1152 Encounter for screening for COVID-19: Secondary | ICD-10-CM

## 2022-03-20 DIAGNOSIS — X58XXXA Exposure to other specified factors, initial encounter: Secondary | ICD-10-CM | POA: Diagnosis present

## 2022-03-20 DIAGNOSIS — Z91018 Allergy to other foods: Secondary | ICD-10-CM

## 2022-03-20 DIAGNOSIS — T1491XA Suicide attempt, initial encounter: Secondary | ICD-10-CM | POA: Diagnosis present

## 2022-03-20 DIAGNOSIS — Z0389 Encounter for observation for other suspected diseases and conditions ruled out: Secondary | ICD-10-CM

## 2022-03-20 MED ORDER — MAGNESIUM HYDROXIDE 400 MG/5ML PO SUSP: 30.0000 mL | Freq: Every day | ORAL | Status: DC | PRN

## 2022-03-20 MED ORDER — TRAZODONE HCL 50 MG PO TABS
50.0000 mg | ORAL_TABLET | Freq: Every evening | ORAL | Status: DC | PRN
  Filled 2022-03-20 (×2): qty 1

## 2022-03-20 MED ORDER — ALUM & MAG HYDROXIDE-SIMETH 200-200-20 MG/5ML PO SUSP: 30.0000 mL | ORAL | Status: DC | PRN

## 2022-03-20 MED ORDER — ACETAMINOPHEN 325 MG PO TABS: 650.0000 mg | ORAL_TABLET | Freq: Four times a day (QID) | ORAL | Status: DC | PRN

## 2022-03-20 MED ORDER — HYDROXYZINE HCL 25 MG PO TABS
25.0000 mg | ORAL_TABLET | Freq: Three times a day (TID) | ORAL | Status: DC | PRN
  Filled 2022-03-20 (×2): qty 1

## 2022-03-20 NOTE — Progress Notes (Signed)
Provider was able to reach out to patient's girlfriend for collateral Roberto Buchanan, [phone]): Per patient's girlfriend, she last spoke with the patient roughly 2 days ago.  During their last conversation, patient told her that he had a stomachache after taking some medication.  She reported that she suggested to the patient to go to the ED for an assessment.  She denied having any concerns over his wellbeing during their last conversation.  She reports that the patient never gave any indication that he was depressed and states that whenever they hang out, he is always happy.  She denied patient engaging in risky behavior recently.  She reported that the patient did not appear to be suicidal and just felt a little under the weather the last time they talked.  She reports that the patient has no access to weapons in the home and does not feel that the patient is a danger to himself at this time.

## 2022-03-20 NOTE — Plan of Care (Signed)
  Problem: Education: Goal: Knowledge of [place] [facility] Education information/materials will improve Outcome: Progressing   Problem: Education: Goal: Emotional status will improve Outcome: Progressing   Problem: Education: Goal: Mental status will improve Outcome: Progressing   Problem: Education: Goal: Verbalization of understanding the information provided will improve Outcome: Progressing   Problem: Coping: Goal: Ability to demonstrate self-control will improve Outcome: Progressing   Problem: Safety: Goal: Periods of time without injury will increase Outcome: Progressing

## 2022-03-20 NOTE — Group Note (Signed)
LCSW Group Therapy Note   Group Date: 03/20/2022 Start Time: 1100 End Time: 1200  Type of Therapy and Topic:  Group Therapy - Healthy vs Unhealthy Coping Skills  Participation Level:  Did Not Attend   Description of Group The focus of this group was to determine what unhealthy coping techniques typically are used by group members and what healthy coping techniques would be helpful in coping with various problems. Patients were guided in becoming aware of the differences between healthy and unhealthy coping techniques. Patients were asked to identify 2-3 healthy coping skills they would like to learn to use more effectively.  Therapeutic Goals Patients learned that coping is what human beings do all day long to deal with various situations in their lives Patients defined and discussed healthy vs unhealthy coping techniques Patients identified their preferred coping techniques and identified whether these were healthy or unhealthy Patients determined 2-3 healthy coping skills they would like to become more familiar with and use more often. Patients provided support and ideas to each other   Summary of Patient Progress:  Did not attend     Therapeutic Modalities Cognitive Behavioral Therapy Motivational Interviewing  Roberto Buchanan, LCSWA 03/20/2022  11:56 AM    

## 2022-03-20 NOTE — [facility] Group Notes (Signed)
10 out of 10 Goal: communicate with doctor

## 2022-03-20 NOTE — Progress Notes (Addendum)
   03/20/22 0200  Psych Admission Type (Psych Patients Only)  Admission Status Involuntary  Psychosocial Assessment  Patient Complaints None  Eye Contact Fair  Facial Expression Animated  Affect Appropriate to circumstance  Speech Logical/coherent  Interaction Assertive  Motor Activity Other (Comment) (wnl)  Appearance/Hygiene In scrubs;Unremarkable  Behavior Characteristics Cooperative;Appropriate to situation  Mood Pleasant  Thought Process  Coherency WDL  Content WDL  Delusions None reported or observed  Perception WDL  Hallucination None reported or observed  Judgment WDL  Confusion None  Danger to Self  Current suicidal ideation? Denies (denies)  Danger to Others  Danger to Others None reported or observed   Pt denies SI, HI, AVH and pain. Pt has slight temp of 99.3. Denies being around anyone with Covid or illness. No symptoms of illness reported or observed. First IP visit. Explained unit procedures. All questions answered.

## 2022-03-20 NOTE — Progress Notes (Signed)
   03/20/22 1000  Psych Admission Type (Psych Patients Only)  Admission Status Involuntary  Psychosocial Assessment  Patient Complaints None  Eye Contact Brief  Facial Expression Animated  Affect Appropriate to circumstance  Speech Logical/coherent  Interaction Assertive  Motor Activity Other (Comment) (wdl)  Appearance/Hygiene Unremarkable  Behavior Characteristics Cooperative  Mood Pleasant  Thought Process  Coherency WDL  Content WDL  Delusions None reported or observed  Perception WDL  Hallucination None reported or observed  Judgment WDL  Confusion None  Danger to Self  Current suicidal ideation? Denies  Danger to Others  Danger to Others None reported or observed

## 2022-03-20 NOTE — Progress Notes (Deleted)
   03/20/22 2300  Psych Admission Type (Psych Patients Only)  Admission Status Involuntary  Psychosocial Assessment  Patient Complaints None  Eye Contact Fair  Facial Expression Animated  Affect Appropriate to circumstance  Speech Logical/coherent  Interaction Assertive  Motor Activity Other (Comment) (wnl)  Appearance/Hygiene Unremarkable  Behavior Characteristics Cooperative;Calm;Appropriate to situation  Mood Pleasant  Thought Process  Coherency WDL  Content WDL  Delusions None reported or observed  Perception WDL  Hallucination None reported or observed  Judgment WDL  Confusion None  Danger to Self  Current suicidal ideation? Denies  Danger to Others  Danger to Others None reported or observed

## 2022-03-20 NOTE — ED Notes (Signed)
Pt requested sprite and given the same

## 2022-03-20 NOTE — Progress Notes (Signed)
   03/20/22 2300  Psych Admission Type (Psych Patients Only)  Admission Status Involuntary  Psychosocial Assessment  Patient Complaints None  Eye Contact Fair  Facial Expression Animated  Affect Appropriate to circumstance  Speech Logical/coherent  Interaction Assertive  Motor Activity Other (Comment) (wnl)  Appearance/Hygiene Unremarkable  Behavior Characteristics Cooperative;Calm;Appropriate to situation  Mood Pleasant  Thought Process  Coherency WDL  Content WDL  Delusions None reported or observed  Perception WDL  Hallucination None reported or observed  Judgment WDL  Confusion None  Danger to Self  Current suicidal ideation? Denies  Agreement Not to Harm Self Yes  Description of Agreement VERBAL  Danger to Others  Danger to Others None reported or observed

## 2022-03-20 NOTE — Progress Notes (Signed)
Pt was accepted to [facility] TODAY 03/20/22; Bed Assignment 405-1  Dx Suicidal  Pt meets inpatient criteria per [name], NP  Attending Physician will be Dr. Sherron Flemings  Report can be called to: -Adult unit: [phone]  Pt can arrive: BED IS READY  Care Team notified: Beverly Milch, NP, Ignacia Felling, EMT-P, Clydia Llano, Jacques Navy, RN Fransico Michael, RN, Allen Kell, Ceasar Mons, RN, [facility]. Azell Der, RN, Bobetta Lime, RN, [address]  [place], LCSWA 03/20/2022 @ 1:21 AM

## 2022-03-20 NOTE — H&P (Addendum)
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 to assess and evaluate symptoms and progress in treatment and Medication management  Observation Level/Precautions:  15 minute checks  Laboratory: Labs were independently reviewed on 03/20/2022  Psychotherapy:  Unit group sessions  Medications:  See MAR. Patient is refusing medications at this time  Consultations:  To be determined  Discharge Concerns: Safety and mood stability  Estimated LOS: 5 - 7 days. LOS  dependent on information provided by collateral  Other:  N/A   Physician Treatment Plan for Primary Diagnosis: Suicide attempt [facility]) Long Term Goal(s): Improvement in symptoms so as ready for discharge  Short Term Goals: Ability to identify changes in lifestyle to reduce recurrence of condition will improve, Ability to verbalize feelings will improve, Ability to disclose and discuss suicidal ideas, Ability to demonstrate self-control will improve, Ability to identify and develop effective coping behaviors will improve, Ability to maintain clinical measurements within normal limits will improve, Compliance with prescribed medications will improve, and Ability to identify triggers associated with substance abuse/mental health issues will improve  Physician Treatment Plan for Secondary Diagnosis: Principal Problem:   Suicide attempt [facility]) Active Problems:   Observation following alleged suicide attempt  Long Term Goal(s): Improvement in symptoms so as ready for discharge  Short Term Goals: Ability to identify changes in lifestyle to reduce recurrence of condition will improve, Ability to verbalize feelings will improve, Ability to disclose and discuss suicidal ideas, Ability to demonstrate self-control will improve, Ability to identify and develop effective coping behaviors will improve, Ability to maintain clinical measurements within normal limits will improve, Compliance with prescribed medications will improve, and Ability to identify triggers associated with substance abuse/mental health issues will improve  Plan: Patient reports that he does not know why he was admitted to this facility.  Patient is continuing to deny ideations or suicide attempt prior to being admitted to this facility.  Patient denies depression or anxiety at this time.  Patient has no evidence of psychosis during the assessment and continues to deny suicidal or homicidal ideations.  Patient is refusing medication management  at this time and is given permission to receive collateral from his stepsister and girlfriend.  Due to patient refusing medication management, provider will continue to monitor patient's mood  Safety and Monitoring: Voluntary admission to inpatient psychiatric unit for safety, stabilization and treatment Daily contact with patient to assess and evaluate symptoms and progress in treatment Patient's case to be discussed in multi-disciplinary team meeting Observation Level : q15 minute checks Vital signs: q12 hours Precautions: safety  Diagnosis:  Principal Problem:   Suicide attempt [facility]) Active Problems:   Observation following alleged suicide attempt  #Suicide Attempt #Observation following  alleged suicide attempt -Patient continues to deny suicidal ideations or suicide attempt prior to admission -Patient is currently under IVC -Patient is refusing medication management at this time -Patient has given verbal consent to reach out to his stepsister and girlfriend for collateral -Will continue to monitor patient's mood  As needed medications: -Patient to continue taking Tylenol 650 mg every 6 hours as needed for mild pain -Patient to continue taking Maalox/Mylanta 30 mL every 4 hours as needed for indigestion -Patient to continue taking Milk of Magnesia 30 mL as needed for mild constipation -Continue Hydroxyzine 25 mg 3 times daily as needed for anxiety  Discharge Planning: Social work and case management to assist with discharge planning and identification of hospital follow-up needs prior to discharge Estimated LOS: 5-7 days Discharge Concerns: Need to establish a safety plan; Medication compliance and effectiveness Discharge Goals: Return home with outpatient referrals for mental health follow-up including medication management/psychotherapy   I certify that inpatient services furnished can reasonably be expected to improve the patient's condition.    Meta Hatchet,  PA 12/12/20233:01 PM

## 2022-03-20 NOTE — Tx Team (Signed)
Initial Treatment Plan 03/20/2022 2:58 AM Roberto Buchanan YWV:[id number]    PATIENT STRESSORS: Other: none identified     PATIENT STRENGTHS: Average or above average intelligence  Motivation for treatment/growth  Physical Health  Supportive family/friends    PATIENT IDENTIFIED PROBLEMS: Depression   Cutting (most recent 3 days ago)  ("Pt says he has no stressors and was not trying to kill himself.")                 DISCHARGE CRITERIA:  Improved stabilization in mood, thinking, and/or behavior  PRELIMINARY DISCHARGE PLAN: Return to previous living arrangement Return to previous work or school arrangements  PATIENT/FAMILY INVOLVEMENT: This treatment plan has been presented to and reviewed with the patient, Roberto Buchanan, and/or family member.  The patient and family have been given the opportunity to ask questions and make suggestions.  [name], RN 03/20/2022, 2:58 AM
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
## 2022-03-20 NOTE — [facility] Counselor (Signed)
Adult Comprehensive Assessment  Patient ID: DORAN NESTLE, male   DOB: 17-Sep-2000, 21 y.o.   MRN: [id number]  Information Source: Information source: Patient  Current Stressors:  Patient states their primary concerns and needs for treatment are:: "I took some pills for a headache but I do not have any depression, anxiety, or suicidal thoughts" Patient states their goals for this hospitilization and ongoing recovery are:: " To leave" Educational / Learning stressors: Pt reports having a 12th grade education Employment / Job issues: Pt reports working for [employer] Family Relationships: Pt reports no stressors Surveyor, quantity / Lack of resources (include bankruptcy): Pt reports some financial stress but states that his mother is a Equities trader / Lack of housing: Pt reports living with his mother, stepfather, and 3 siblings but states that he often stays at his girlfriend's home Physical health (include injuries & life threatening diseases): Pt reports no stressors Social relationships: Pt reports no stressors Substance abuse: Pt reports his last use of Marijuana was 2 months ago Bereavement / Loss: Pt reports no stressors  Living/Environment/Situation:  Living Arrangements: Spouse/significant other, Parent, Other relatives Living conditions (as described by patient or guardian): House/[place] Who else lives in the home?: Mother, step-father, and 3 siblings.  Pt states that he also stays at his girlfriend's home. How long has patient lived in current situation?: 2 months What is atmosphere in current home: Comfortable, Supportive  Family History:  Marital status: Long term relationship Long term relationship, how long?: 7 months What types of issues is patient dealing with in the relationship?: None reported Are you sexually active?: Yes What is your sexual orientation?: Heterosexual Has your sexual activity been affected by drugs, alcohol, medication, or emotional stress?: No Does  patient have children?: Yes How many children?: 1 How is patient's relationship with their children?: "I have a daughter that lives with my ex-wife and I get to see and talk to her any time I want"  Childhood History:  By whom was/is the patient raised?: Mother Additional childhood history information: Pt reports he does not know his biological father Description of patient's relationship with caregiver when they were a child: "We had an amazing relationship" Patient's description of current relationship with people who raised him/her: "We still have an amazing relationship" How were you disciplined when you got in trouble as a child/adolescent?: Spankings and Groundings Does patient have siblings?: Yes Number of Siblings: 3 Description of patient's current relationship with siblings: "I have 2 brothers and 1 sister and we get along very well and hang out together often" Did patient suffer any verbal/emotional/physical/sexual abuse as a child?: No Did patient suffer from severe childhood neglect?: No Has patient ever been sexually abused/assaulted/raped as an adolescent or adult?: No Was the patient ever a victim of a crime or a disaster?: No Witnessed domestic violence?: No Has patient been affected by domestic violence as an adult?: No  Education:  Highest grade of school patient has completed: 12th grade Currently a student?: No Learning disability?: No  Employment/Work Situation:   Employment Situation: Employed Where is Patient Currently Employed?: [employer] How Long has Patient Been Employed?: 4 months Are You Satisfied With Your Job?: Yes Do You Work More Than One Job?: No Work Stressors: Pt reports no work stressors Patient's Job has Been Impacted by Current Illness: No What is the Longest Time Patient has Held a Job?: 4 years Where was the Patient Employed at that Time?: D[employer] Has Patient ever Been in the [employer]?: No  Financial  Resources:   Financial resources: Income  from employment, Support from parents / caregiver Does patient have a representative payee or guardian?: No  Alcohol/Substance Abuse:   What has been your use of drugs/alcohol within the last 12 months?: Pt reports his last use of Marijuana was 2 months ago If attempted suicide, did drugs/alcohol play a role in this?: No Alcohol/Substance Abuse Treatment Hx: Denies past history Has alcohol/substance abuse ever caused legal problems?: Yes (Pt reports having a childsupport court date in December of 2023 but is not certain of the exact date.)  Social Support System:   Patient's Community Support System: Production assistant, radio System: Girlfriend, family, friends, church Type of faith/religion: [employer]ri[employer] Knuckles How does patient's faith help to cope with current illness?: "I go to church 3 times a week"  Leisure/Recreation:   Do You Have Hobbies?: Yes Leisure and Hobbies: Listen to music, basketball, drawing  Strengths/Needs:   What is the patient's perception of their strengths?: "Cookiing" Patient states they can use these personal strengths during their treatment to contribute to their recovery: "I don't have anything to recover from" Patient states these barriers may affect/interfere with their treatment: None Patient states these barriers may affect their return to the community: None Other important information patient would like considered in planning for their treatment: None  Discharge Plan:   Currently receiving community mental health services: No Patient states concerns and preferences for aftercare planning are: Pt reports that he is not interested in therapy and medication management services Patient states they will know when they are safe and ready for discharge when: "I am ready to leave now" Does patient have access to transportation?: Yes (Own car at "one of the hospitals [facility]") Does patient have financial barriers related to discharge  medications?: Yes Patient description of barriers related to discharge medications: No medical insurance Will patient be returning to same living situation after discharge?: Yes  Summary/Recommendations:   Summary and Recommendations (to be completed by the evaluator): Shubh Chiara is a 21 year old, male, who was admitted to the hospital due to depression and suicidal thoughts.  The pt reports that he took 6 Tylenol pills and later vomited them up at his girlfriend's home.  He states that he came to the hospital to get checked due to the vomiting.  He denies any suicidal thoughts, attempts, depression, or anxiety.  The Pt reports that he is living with his mother, step-father, and 3 siblings.  He states that he often resides at his girlfriend's home as well.  He states that he has been in a relationship with his girlfriend for 7 months and has no concerns with the relationship at this time.  He states that he has 1 daughter who lives with his ex-wife and he states "I get to see and talk to her whenever I want".  The Pt reports having a close relationship with his mother, step-father, and siblings.  He denies any childhood abuse or trauma.  He reports having a 12th grade education and states that he plans to join the [employer] in January 2024.  The pt reports working for [employer] and states that his mother is also a financial support for him.  He states that he does not have any medical insurance at this time.  The pt reports that his last use of Marijuana was 2 months ago and he denies any other substance use.  He also denies any current or previous substance use treatment.  The  Pt also reports that he has an up-coming court date in December of 2023 but he is uncertain about the exact date at this time. While in the hospital the Pt can benefit from crisis stabilization, medication evaluation, group therapy, psycho-education, case management, and discharge planning. Upon discharge the Pt would like to return home with  his girlfriend. It is recommended that that Pt follow-up with a local outpatinet provider for therapy and medication management.  The pt states that he is not interested in therapy or medication management services at this time.  The Pt will be provided with information for local services in the [place] area. It is also recommended that the Pt continue to take all medications as prescribed until directed to do otherwise by his providers  Aram Beecham. 03/20/2022
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
## 2022-03-21 ENCOUNTER — Encounter (HOSPITAL_COMMUNITY): Payer: Self-pay

## 2022-03-21 LAB — RESP PANEL BY RT-PCR (RSV, FLU A&B, COVID)  RVPGX2
Influenza A by PCR: NEGATIVE
Influenza B by PCR: NEGATIVE
Resp Syncytial Virus by PCR: NEGATIVE
SARS Coronavirus 2 by RT PCR: NEGATIVE

## 2022-03-21 NOTE — Progress Notes (Cosign Needed Addendum)
[facility] MD Progress Note  03/21/2022 10:56 AM Roberto Buchanan  MRN:  [id number]  Reason for admission: 21 year old, male with no documented past psychiatric history who was involuntarily admitted to [facility] from [facility] ED due to suicidal ideation.  Per chart review, patient states that he felt suicidal last night and took unidentified pills that were in his car.  Patient reported that he believed that some of the pills may have been oxycodone while the others may have been Tylenol.  Prior to presenting to the ED, patient reported that he felt woozy the previous night.  He denied abdominal pain, nausea, vomiting, diarrhea, lightheadedness, dizziness, palpitations, chest pain, or shortness of breath during the assessment.  Patient reported that he did not still feel suicidal to the extent of wanting to hurt himself but did endorse depression.   Daily notes: Roberto Buchanan is seen, chart reviewed. The chart findings discussed with the treatment team. He presents alert, oriented & aware of situation. He is visible on the unit while wearing his face mask as instructed & reccommended. He says he is doing great. Denies any symptoms of depression or anxiety. Says he is sleeping well at night & appetite is good. He says he was misunderstood by the ED staff when he went in for evaluation after what he described as taken too much tylenol tablets because he was having bad headache pain. 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 He blamed his headache on the stressful night shift position. However, he says he loves this job & it pays him well. He says he loves his life, has so much going for him. He says he has his girlfriend he loves dearly, loving siblings & a supportive parents. Roberto Buchanan says he has no reason to take his life. He says he does not need to be or take any medications that he does not need because he is not depressed. He currently  denies any SIHI, AVH, delusional thoughts or paranoia. He does not appear to be responding to any internal stimuli. Discussed this case with the attending psychiatrist. The plan is to probably discharge patient tomorrow as he is not willing to take any antidepressant regimen while denying any concerning issues or complaints.  Principal Problem: Suicide attempt [facility])  Diagnosis: Principal Problem:   Suicide attempt [facility]) Active Problems:   Observation following alleged suicide attempt  Total Time spent with patient:  35 minutes  Past Psychiatric History: See H&P.  Past Medical History:  Past Medical History:  Diagnosis Date   Attention deficit hyperactivity disorder     Past Surgical History:  Procedure Laterality Date   LAPAROSCOPIC APPENDECTOMY N/A 02/17/2017   Procedure: APPENDECTOMY LAPAROSCOPIC;  Surgeon: Kandice Hams, MD;  Location: [facility] OR;  Service: Pediatrics;  Laterality: N/A;   ORTHOPEDIC SURGERY     On Elbow    Family History:  Family History  Problem Relation Age of Onset   Heart disease Maternal Aunt    Drug abuse Maternal Aunt    Drug abuse Maternal Uncle    Family Psychiatric  History: See H&P.  Social History:  Social History   Substance and Sexual Activity  Alcohol Use Not Currently     Social History   Substance and Sexual Activity  Drug Use Not on file   Comment: last use one month ago    Social History   Socioeconomic History   Marital status: Single  Spouse name: Not on file   Number of children: 1   Years of education: Not on file   Highest education level: Not on file  Occupational History   Not on file  Tobacco Use   Smoking status: Never   Smokeless tobacco: Never  Vaping Use   Vaping Use: Every day   Substances: Nicotine  Substance and Sexual Activity   Alcohol use: Not Currently   Drug use: Not on file    Comment: last use one month ago   Sexual activity: Not on file  Other Topics Concern   Not on file  Social History  Narrative   Not on file   Social Determinants of Health   Financial Resource Strain: Not on file  Food Insecurity: No Food Insecurity (03/20/2022)   Hunger Vital Sign    Worried About Running Out of Food in the Last Year: Never true    Ran Out of Food in the Last Year: Never true  Transportation Needs: No Transportation Needs (03/20/2022)   PRAPARE - Administrator, Civil Service (Medical): No    Lack of Transportation (Non-Medical): No  Physical Activity: Not on file  Stress: Not on file  Social Connections: Not on file   Additional Social History:   Sleep: Good  Appetite:  Good  Current Medications: Current Facility-Administered Medications  Medication Dose Route Frequency Provider Last Rate Last Admin   acetaminophen (TYLENOL) tablet 650 mg  650 mg Oral Q6H PRN Adegbola, Bukola O, NP       alum & mag hydroxide-simeth (MAALOX/MYLANTA) 200-200-20 MG/5ML suspension 30 mL  30 mL Oral Q4H PRN Adegbola, Bukola O, NP       hydrOXYzine (ATARAX) tablet 25 mg  25 mg Oral TID PRN Adegbola, Bukola O, NP       magnesium hydroxide (MILK OF MAGNESIA) suspension 30 mL  30 mL Oral Daily PRN Adegbola, Bukola O, NP       traZODone (DESYREL) tablet 50 mg  50 mg Oral QHS PRN Massengill, Nathan, MD       Lab Results: No results found for this or any previous visit (from the past 48 hour(s)).  Blood Alcohol level:  Lab Results  Component Value Date   ETH <10 03/19/2022   Metabolic Disorder Labs: No results found for: "HGBA1C", "MPG" No results found for: "PROLACTIN" No results found for: "CHOL", "TRIG", "HDL", "CHOLHDL", "VLDL", "LDLCALC"  Physical Findings: AIMS:  , ,  ,  ,    CIWA:    COWS:     Musculoskeletal: Strength & Muscle Tone: within normal limits Gait & Station: normal Patient leans: N/A  Psychiatric Specialty Exam:  Presentation  General Appearance:  Appropriate for Environment; Fairly Groomed  Eye Contact: Good  Speech: Clear and Coherent; Normal  Rate  Speech Volume: Normal  Handedness: Right   Mood and Affect  Mood: Irritable  Affect: Congruent  Thought Process  Thought Processes: Coherent  Descriptions of Associations:Intact  Orientation:Full (Time, Place and Person)  Thought Content:WDL  History of Schizophrenia/Schizoaffective disorder:No  Duration of Psychotic Symptoms:No data recorded Hallucinations:Hallucinations: None  Ideas of Reference:None  Suicidal Thoughts:Suicidal Thoughts: No  Homicidal Thoughts:Homicidal Thoughts: No  Sensorium  Memory: Immediate Good; Recent Good; Remote Good  Judgment: Good  Insight: Fair  Art therapist  Concentration: Good  Attention Span: Good  Recall: Good  Fund of Knowledge: Good  Language: Good  Psychomotor Activity  Psychomotor Activity: Psychomotor Activity: Normal  Assets  Assets: Communication Skills; Desire for Improvement;  Housing; Social Support; Physical Health; Transportation; Vocational/Educational  Sleep  Sleep: Sleep: Good  Physical Exam: Physical Exam Vitals and nursing note reviewed.  HENT:     Nose: No congestion or rhinorrhea.     Mouth/Throat:     Comments: Whispers; says lost his throat yesterday.  Denies any pain/discomforts. Denies any difficulties swallowing. Cardiovascular:     Rate and Rhythm: Normal rate.     Pulses: Normal pulses.  Pulmonary:     Effort: Pulmonary effort is normal.  Genitourinary:    Comments: Deferred Musculoskeletal:        General: Normal range of motion.     Cervical back: Normal range of motion.  Skin:    General: Skin is warm and dry.  Neurological:     General: No focal deficit present.     Mental Status: He is alert and oriented to person, place, and time.    Review of Systems  Constitutional:  Negative for chills, diaphoresis and fever.  HENT:  Negative for congestion and sore throat.        Whispers; says lost his throat yesterday.  Denies any  pain/discomforts. Denies any difficulties swallowing.   Respiratory:  Negative for cough, shortness of breath and wheezing.   Cardiovascular:  Negative for chest pain and palpitations.  Gastrointestinal:  Negative for abdominal pain, constipation, diarrhea, heartburn, nausea and vomiting.  Genitourinary:  Negative for dysuria.  Musculoskeletal:  Negative for joint pain and myalgias.  Neurological:  Negative for dizziness, tingling, tremors, sensory change, speech change, focal weakness, seizures, loss of consciousness, weakness and headaches.  Psychiatric/Behavioral:  Positive for substance abuse (Hx. THC). Negative for depression, hallucinations, memory loss and suicidal ideas. The patient is not nervous/anxious and does not have insomnia.    Blood pressure 108/81, pulse 80, temperature 98.9 F (37.2 C), temperature source Oral, resp. rate 20, height 6\' 3"  (1.905 m), weight 83.9 kg, SpO2 100 %. Body mass index is 23.12 kg/m.  Treatment Plan Summary: Daily contact with patient to assess and evaluate symptoms and progress in treatment and Medication management.   Continue inpatient hospitalization.  Will continue today 03/21/2022 plan as below except where it is noted.   Plan: per previous report. Patient reports that he does not know why he was admitted to this facility.  Patient is continuing to deny ideations or suicide attempt prior to being admitted to this facility.  Patient denies depression or anxiety at this time.  Patient has no evidence of psychosis during the assessment and continues to deny suicidal or homicidal ideations.  Patient is refusing medication management at this time and is given permission to receive collateral from his stepsister and girlfriend.  Due to patient refusing medication management, provider will continue to monitor patient's mood   Safety and Monitoring: Voluntary admission to inpatient psychiatric unit for safety, stabilization and treatment Daily contact  with patient to assess and evaluate symptoms and progress in treatment Patient's case to be discussed in multi-disciplinary team meeting Observation Level : q15 minute checks Vital signs: q12 hours Precautions: safety   Diagnosis:  Principal Problem:   Suicide attempt [facility]) Active Problems:   Observation following alleged suicide attempt   #Suicide Attempt #Observation following alleged suicide attempt -Patient continues to deny suicidal ideations or suicide attempt prior to admission -Patient is currently under IVC -Patient is refusing medication management at this time -Patient has given verbal consent to reach out to his stepsister and girlfriend for collateral -Will continue to monitor patient's mood  As needed medications: -Patient to continue taking Tylenol 650 mg every 6 hours as needed for mild pain -Patient to continue taking Maalox/Mylanta 30 mL every 4 hours as needed for indigestion -Patient to continue taking Milk of Magnesia 30 mL as needed for mild constipation -Continue Hydroxyzine 25 mg 3 times daily as needed for anxiety   Discharge Planning: Social work and case management to assist with discharge planning and identification of hospital follow-up needs prior to discharge Estimated LOS: 5-7 days Discharge Concerns: Need to establish a safety plan; Medication compliance and effectiveness Discharge Goals: Return home with outpatient referrals for mental health follow-up including medication management/psychotherapy     Armandina Stammer, NP, pmhnp, fnp-bc. 03/21/2022, 10:56 AM

## 2022-03-21 NOTE — Progress Notes (Signed)
The patient attended the evening N.A.meeting.  

## 2022-03-21 NOTE — BH IP Treatment Plan (Signed)
Interdisciplinary Treatment and Diagnostic Plan Update  03/21/2022 Time of Session: 9:15am  Roberto Buchanan MRN: [id number]  Principal Diagnosis: Suicide attempt [facility])  Secondary Diagnoses: Principal Problem:   Suicide attempt [facility]) Active Problems:   Observation following alleged suicide attempt   Current Medications:  Current Facility-Administered Medications  Medication Dose Route Frequency Provider Last Rate Last Admin   acetaminophen (TYLENOL) tablet 650 mg  650 mg Oral Q6H PRN Adegbola, Bukola O, NP       alum & mag hydroxide-simeth (MAALOX/MYLANTA) 200-200-20 MG/5ML suspension 30 mL  30 mL Oral Q4H PRN Adegbola, Bukola O, NP       hydrOXYzine (ATARAX) tablet 25 mg  25 mg Oral TID PRN Adegbola, Bukola O, NP       magnesium hydroxide (MILK OF MAGNESIA) suspension 30 mL  30 mL Oral Daily PRN Adegbola, Bukola O, NP       traZODone (DESYREL) tablet 50 mg  50 mg Oral QHS PRN Massengill, Nathan, MD       PTA Medications: No medications prior to admission.    Patient Stressors: Other: none identified    Patient Strengths: Average or above average intelligence  Motivation for treatment/growth  Physical Health  Supportive family/friends   Treatment Modalities: Medication Management, Group therapy, Case management,  1 to 1 session with clinician, Psychoeducation, Recreational therapy.   Physician Treatment Plan for Primary Diagnosis: Suicide attempt [facility]) Long Term Goal(s): Improvement in symptoms so as ready for discharge   Short Term Goals: Ability to identify changes in lifestyle to reduce recurrence of condition will improve Ability to verbalize feelings will improve Ability to disclose and discuss suicidal ideas Ability to demonstrate self-control will improve Ability to identify and develop effective coping behaviors will improve Ability to maintain clinical measurements within normal limits will improve Compliance with prescribed medications will improve Ability to  identify triggers associated with substance abuse/mental health issues will improve  Medication Management: Evaluate patient's response, side effects, and tolerance of medication regimen.  Therapeutic Interventions: 1 to 1 sessions, Unit Group sessions and Medication administration.  Evaluation of Outcomes: Not Met  Physician Treatment Plan for Secondary Diagnosis: Principal Problem:   Suicide attempt [facility]) Active Problems:   Observation following alleged suicide attempt  Long Term Goal(s): Improvement in symptoms so as ready for discharge   Short Term Goals: Ability to identify changes in lifestyle to reduce recurrence of condition will improve Ability to verbalize feelings will improve Ability to disclose and discuss suicidal ideas Ability to demonstrate self-control will improve Ability to identify and develop effective coping behaviors will improve Ability to maintain clinical measurements within normal limits will improve Compliance with prescribed medications will improve Ability to identify triggers associated with substance abuse/mental health issues will improve     Medication Management: Evaluate patient's response, side effects, and tolerance of medication regimen.  Therapeutic Interventions: 1 to 1 sessions, Unit Group sessions and Medication administration.  Evaluation of Outcomes: Not Met   RN Treatment Plan for Primary Diagnosis: Suicide attempt [facility]) Long Term Goal(s): Knowledge of disease and therapeutic regimen to maintain health will improve  Short Term Goals: Ability to remain free from injury will improve, Ability to participate in decision making will improve, Ability to verbalize feelings will improve, Ability to disclose and discuss suicidal ideas, and Ability to identify and develop effective coping behaviors will improve  Medication Management: RN will administer medications as ordered by provider, will assess and evaluate patient's response and provide  education to patient for prescribed medication.  RN will report any adverse and/or side effects to prescribing provider.  Therapeutic Interventions: 1 on 1 counseling sessions, Psychoeducation, Medication administration, Evaluate responses to treatment, Monitor vital signs and CBGs as ordered, Perform/monitor CIWA, COWS, AIMS and Fall Risk screenings as ordered, Perform wound care treatments as ordered.  Evaluation of Outcomes: Not Met   LCSW Treatment Plan for Primary Diagnosis: Suicide attempt [facility]) Long Term Goal(s): Safe transition to appropriate next level of care at discharge, Engage patient in therapeutic group addressing interpersonal concerns.  Short Term Goals: Engage patient in aftercare planning with referrals and resources, Increase social support, Increase emotional regulation, Facilitate acceptance of mental health diagnosis and concerns, Identify triggers associated with mental health/substance abuse issues, and Increase skills for wellness and recovery  Therapeutic Interventions: Assess for all discharge needs, 1 to 1 time with Social worker, Explore available resources and support systems, Assess for adequacy in community support network, Educate family and significant other(s) on suicide prevention, Complete Psychosocial Assessment, Interpersonal group therapy.  Evaluation of Outcomes: Not Met   Progress in Treatment: Attending groups: Yes. Participating in groups: Yes. Taking medication as prescribed: Yes. Toleration medication: Yes. Family/Significant other contact made: Yes, individual(s) contacted:  Girlfriend  Patient understands diagnosis: Yes. Discussing patient identified problems/goals with staff: Yes. Medical problems stabilized or resolved: Yes. Denies suicidal/homicidal ideation: Yes. Issues/concerns per patient self-inventory: No.   New problem(s) identified: No, Describe:  None   New Short Term/Long Term Goal(s): medication stabilization, elimination of  SI thoughts, development of comprehensive mental wellness plan.   Patient Goals: "To get out of the hospital"  Discharge Plan or Barriers: Patient recently admitted. CSW will continue to follow and assess for appropriate referrals and possible discharge planning.   Reason for Continuation of Hospitalization: Medication stabilization  Estimated Length of Stay: 3 to 7 days   Last [place] Suicide Severity Risk Score: Kimberly Admission (Current) from 03/20/2022 in [facility] ED from 01/22/2022 in [facility] ED from 12/27/2021 in [facility]  [place] No Risk No Risk No Risk       Last PHQ 2/9 Scores:     No data to display          Scribe for Treatment Team: Darleen Crocker, Latanya Presser 03/21/2022 11:03 AM

## 2022-03-21 NOTE — Progress Notes (Signed)
   03/21/22 0815  Psych Admission Type (Psych Patients Only)  Admission Status Involuntary  Psychosocial Assessment  Patient Complaints None  Eye Contact Brief  Facial Expression Animated  Affect Appropriate to circumstance  Speech Logical/coherent  Interaction Assertive  Motor Activity Other (Comment) (wdl)  Appearance/Hygiene Unremarkable  Behavior Characteristics Cooperative  Mood Pleasant  Thought Process  Coherency WDL  Content WDL  Delusions None reported or observed  Perception WDL  Hallucination None reported or observed  Judgment WDL  Confusion None  Danger to Self  Current suicidal ideation? Denies  Danger to Others  Danger to Others None reported or observed

## 2022-03-22 DIAGNOSIS — T1491XA Suicide attempt, initial encounter: Principal | ICD-10-CM

## 2022-03-22 NOTE — Discharge Summary (Addendum)
Physician Discharge Summary Note  Patient:  Roberto Buchanan is an 21 y.o., male MRN:  [id number] DOB:  2000-09-16 Patient phone:  [phone] (home)  Patient address:   [address] [place] [address],  Total Time spent with patient:  Greater than 30 minutes  Date of Admission:  03/20/2022  Date of Discharge: 03-22-22  Reason for Admission: Suspected suicide attempt by overdose on Tylenol/other narcotic medication.  Principal Problem: Suicide attempt [facility])  Discharge Diagnoses: Principal Problem:   Suicide attempt [facility]) Active Problems:   Observation following alleged suicide attempt  Past Psychiatric History: ADHD.  Past Medical History:  Past Medical History:  Diagnosis Date   Attention deficit hyperactivity disorder     Past Surgical History:  Procedure Laterality Date   LAPAROSCOPIC APPENDECTOMY N/A 02/17/2017   Procedure: APPENDECTOMY LAPAROSCOPIC;  Surgeon: Stanford Scotland, MD;  Location: [place];  Service: Pediatrics;  Laterality: N/A;   ORTHOPEDIC SURGERY     On Elbow    Family History:  Family History  Problem Relation Age of Onset   Heart disease Maternal Aunt    Drug abuse Maternal Aunt    Drug abuse Maternal Uncle    Family Psychiatric  History: See H&P.   Social History:  Social History   Substance and Sexual Activity  Alcohol Use Not Currently     Social History   Substance and Sexual Activity  Drug Use Not on file   Comment: last use one month ago    Social History   Socioeconomic History   Marital status: Single    Spouse name: Not on file   Number of children: 1   Years of education: Not on file   Highest education level: Not on file  Occupational History   Not on file  Tobacco Use   Smoking status: Never   Smokeless tobacco: Never  Vaping Use   Vaping Use: Every day   Substances: Nicotine  Substance and Sexual Activity   Alcohol use: Not Currently   Drug use: Not on file    Comment: last use one month ago   Sexual  activity: Not on file  Other Topics Concern   Not on file  Social History Narrative   Not on file   Social Determinants of Health   Financial Resource Strain: Not on file  Food Insecurity: No Food Insecurity (03/20/2022)   Hunger Vital Sign    Worried About Running Out of Food in the Last Year: Never true    Ran Out of Food in the Last Year: Never true  Transportation Needs: No Transportation Needs (03/20/2022)   PRAPARE - Hydrologist (Medical): No    Lack of Transportation (Non-Medical): No  Physical Activity: Not on file  Stress: Not on file  Social Connections: Not on file   Hospital Course: (Per Md's admission evaluation notes):  21 year old, male with no documented past psychiatric history who was involuntarily admitted to [facility] from [facility] ED due to suicidal ideation.  Per chart review, patient states that he felt suicidal last night and took unidentified pills that were in his car.  Patient reported that he believed that some of the pills may have been oxycodone while the others may have been Tylenol.  Prior to presenting to the ED, patient reported that he felt woozy the previous night.  He denied abdominal pain, nausea, vomiting, diarrhea, lightheadedness, dizziness, palpitations, chest pain, or shortness of breath during the assessment.  Prior to this discharge, Roberto Buchanan was seen & evaluated for mental health stability by his treatment team. The current laboratory findings were reviewed (stable), nurses notes & vital signs were reviewed as well. There are no current mental health or medical issues that should prevent this discharge at this time. Patient is being discharged to continue mental health issues as noted below.  This is the first psychiatric admission/discharge summary for this 21 year old male from this [facility]. He does have a hx of ADHD & received treatments during his childhood years. He was admitted to  the [facility] for evaluation & treatment for depression due to suspected suicide attempt by overdose on tylenol & other narcotics.  After evaluation of his presenting symptoms, Roberto Buchanan was recommended for mood stabilization treatments. The medication regimen for his presenting symptoms were discussed. However, he declined to be on any medications. He expressed that the reasons behind his admission in this [facility] was as a result of misunderstanding. He stated that he accidentally took too many tylenol tablets when he was having bad headache pains. He explained that when he realized that he may have taken too many pills, he told his girlfriend who encouraged him to to the ED for evaluation. Roberto Buchanan stated again that while at the ED, he was encouraged to come to the [facility] for further evaluation & that was how he ended up in this hospital. Throughout his hospitalization stay & through his discharge today, Roberto Buchanan maintained that he is not depressed, anxious, suicidal or homicidal. He says his life is good, good paying job, devoted girlfriend & supportive family. He was enrolled & participated in the group counseling sessions being offered & held on this unit. He learned coping skills. He presented on this admission, no other pre-existing medical condition that required treatments. He was monitored closely by staff as he socialized with the other patients. He maintained a bright affect, good eye contact throughout his hospitalization. There were no behavioral problems displayed.   And because there are no clinical criteria to keep Roberto Buchanan admitted to this [facility], the treatment team has agreed to discharge him today. During the course of his hospitalization, the 15-minute checks were adequate to ensure Roberto Buchanan's safety. Patient did not display any dangerous, violent or suicidal behavior on the unit.  He interacted with patients & staff appropriately, participated appropriately in the group sessions/therapies. He was recommended for  outpatient follow-up care upon discharge to assure his continuity of care.  At the time of discharge patient is not reporting any acute suicidal/homicidal ideations. He feels more confident about his self & mental health care. He currently denies any new issues or concerns. Education & supportive counseling provided throughout her hospital stay & upon discharge.   Today upon his discharge evaluation with his treatment team, Roberto Buchanan shares he is doing well. He denies any other specific concerns. He is sleeping well. His appetite is good. He denies other physical complaints. He denies AH/VH, delusional thoughts or paranoia. He does not appear to be responding to any internal stimuli. He was able to engage in safety planning including plan to return to [facility] or contact emergency services if he feels unable to maintain his own safety or the safety of others. Pt had no further questions, comments, or concerns. He left [facility] with all personal belongings in no apparent distress. Transportation per his girlfriend.  Physical Findings: AIMS: Facial and Oral Movements Muscles of Facial Expression: None, normal Lips and Perioral Area: None, normal Jaw: None, normal Tongue: None, normal,Extremity  Movements Upper (arms, wrists, hands, fingers): None, normal Lower (legs, knees, ankles, toes): None, normal, Trunk Movements Neck, shoulders, hips: None, normal, Overall Severity Severity of abnormal movements (highest score from questions above): None, normal Incapacitation due to abnormal movements: None, normal Patient's awareness of abnormal movements (rate only patient's report): No Awareness, Dental Status Current problems with teeth and/or dentures?: No Does patient usually wear dentures?: No  CIWA:    COWS:     Musculoskeletal: Strength & Muscle Tone: within normal limits Gait & Station: normal Patient leans: N/A  Psychiatric Specialty Exam:  Presentation  General Appearance:  Appropriate for  Environment; Casual; Fairly Groomed  Eye Contact: Good  Speech: Clear and Coherent; Normal Rate  Speech Volume: Normal  Handedness: Right   Mood and Affect  Mood: Euthymic  Affect: Appropriate; Congruent   Thought Process  Thought Processes: Coherent; Goal Directed; Linear  Descriptions of Associations:Intact  Orientation:Full (Time, Place and Person)  Thought Content:Logical  History of Schizophrenia/Schizoaffective disorder:No  Duration of Psychotic Symptoms: NA  Hallucinations:Hallucinations: None  Ideas of Reference:None  Suicidal Thoughts:Suicidal Thoughts: No  Homicidal Thoughts:Homicidal Thoughts: No   Sensorium  Memory: Immediate Good; Recent Good; Remote Good  Judgment: Good  Insight: Good  Executive Functions  Concentration: Good  Attention Span: Good  Recall: Good  Fund of Knowledge: Good  Language: Good  Psychomotor Activity  Psychomotor Activity:Psychomotor Activity: Normal  Assets  Assets: Communication Skills; Desire for Improvement; Financial Resources/Insurance; Housing; Physical Health; Resilience; Social Support; Vocational/Educational   Sleep  Sleep:Sleep: Good Number of Hours of Sleep: 6.75  Physical Exam: Physical Exam Vitals and nursing note reviewed.  HENT:     Head: Normocephalic.     Nose: Nose normal.     Mouth/Throat:     Pharynx: Oropharynx is clear.  Eyes:     Pupils: Pupils are equal, round, and reactive to light.  Cardiovascular:     Rate and Rhythm: Normal rate.     Pulses: Normal pulses.  Pulmonary:     Effort: Pulmonary effort is normal.  Genitourinary:    Comments: Deferred Musculoskeletal:        General: Normal range of motion.     Cervical back: Normal range of motion.  Skin:    General: Skin is warm and dry.  Neurological:     General: No focal deficit present.     Mental Status: He is alert and oriented to person, place, and time. Mental status is at baseline.     Review of Systems  Constitutional:  Negative for chills, diaphoresis and fever.  HENT:  Negative for congestion and sore throat.   Eyes:  Negative for blurred vision.  Respiratory:  Negative for cough, shortness of breath and wheezing.   Cardiovascular:  Negative for chest pain and palpitations.  Gastrointestinal:  Negative for abdominal pain, diarrhea, heartburn, nausea and vomiting.  Genitourinary:  Negative for dysuria.  Musculoskeletal:  Negative for joint pain and myalgias.  Skin:  Negative for itching and rash.  Neurological:  Negative for dizziness, tingling, tremors, sensory change, speech change, focal weakness, seizures, loss of consciousness, weakness and headaches.  Psychiatric/Behavioral:  Positive for substance abuse (Hx. THC use (UDS (+) for THC.). Negative for depression, hallucinations, memory loss and suicidal ideas. The patient is not nervous/anxious and does not have insomnia.    Blood pressure (!) 147/94, pulse 76, temperature 97.8 F (36.6 C), temperature source Oral, resp. rate 16, height 6\' 3"  (1.905 m), weight 83.9 kg, SpO2 100 %. Body mass index  is 23.12 kg/m.   Social History   Tobacco Use  Smoking Status Never  Smokeless Tobacco Never   Tobacco Cessation:  N/A, patient does not currently use tobacco products  Blood Alcohol level:  Lab Results  Component Value Date   ETH <10 [id number]   Metabolic Disorder Labs:  No results found for: "HGBA1C", "MPG" No results found for: "PROLACTIN" No results found for: "CHOL", "TRIG", "HDL", "CHOLHDL", "VLDL", "Chase"  See Psychiatric Specialty Exam and Suicide Risk Assessment completed by Attending Physician prior to discharge.  Discharge destination:  Home  Is patient on multiple antipsychotic therapies at discharge:  No   Has Patient had three or more failed trials of antipsychotic monotherapy by history:  No  Recommended Plan for Multiple Antipsychotic Therapies: NA  Discharge Instructions      Diet - low sodium heart healthy   Complete by: As directed    Increase activity slowly   Complete by: As directed       Allergies as of 03/22/2022       Reactions   Other    Per pt pickles        Medication List    You have not been prescribed any medications.     [place] Follow up.   Specialty: Behavioral Health Why: You may go to this provider for an assessment, to obtain therapy and medication management services at the daily walk in time: Monday through Friday, arrive no later than 7:20 am. Services are provided on a first come, first served basis. Contact information: [place] [phone]               Follow-up recommendations: Activity:  As tolerated Diet: As recommended by your primary care doctor. Keep all scheduled follow-up appointments as recommended.   Comments: No prescription give, Patient declines to be on any medications.  Signed: Lindell Spar, NP, pmhnp, fnp-bc. 03/22/2022, 10:13 AM

## 2022-03-22 NOTE — Plan of Care (Signed)
  Problem: Education: Goal: Knowledge of [facility] [facility] Education information/materials will improve Outcome: Adequate for Discharge Goal: Emotional status will improve Outcome: Adequate for Discharge Goal: Mental status will improve Outcome: Adequate for Discharge Goal: Verbalization of understanding the information provided will improve Outcome: Adequate for Discharge   Problem: Activity: Goal: Interest or engagement in activities will improve Outcome: Adequate for Discharge Goal: Sleeping patterns will improve Outcome: Adequate for Discharge   Problem: Coping: Goal: Ability to verbalize frustrations and anger appropriately will improve Outcome: Adequate for Discharge Goal: Ability to demonstrate self-control will improve Outcome: Adequate for Discharge   Problem: Health Behavior/Discharge Planning: Goal: Identification of resources available to assist in meeting health care needs will improve Outcome: Adequate for Discharge Goal: Compliance with treatment plan for underlying cause of condition will improve Outcome: Adequate for Discharge   Problem: Physical Regulation: Goal: Ability to maintain clinical measurements within normal limits will improve Outcome: Adequate for Discharge   Problem: Safety: Goal: Periods of time without injury will increase Outcome: Adequate for Discharge   Problem: Education: Goal: Ability to make informed decisions regarding treatment will improve Outcome: Adequate for Discharge   Problem: Coping: Goal: Coping ability will improve Outcome: Adequate for Discharge   Problem: Health Behavior/Discharge Planning: Goal: Identification of resources available to assist in meeting health care needs will improve Outcome: Adequate for Discharge   Problem: Medication: Goal: Compliance with prescribed medication regimen will improve Outcome: Adequate for Discharge   Problem: Self-Concept: Goal: Ability to disclose and discuss suicidal ideas  will improve Outcome: Adequate for Discharge Goal: Will verbalize positive feelings about self Outcome: Adequate for Discharge   

## 2022-03-22 NOTE — Plan of Care (Signed)
  Problem: Activity: Goal: Interest or engagement in activities will improve Outcome: Progressing   Problem: Coping: Goal: Ability to demonstrate self-control will improve Outcome: Progressing   

## 2022-03-22 NOTE — [facility] Suicide Risk Assessment (Signed)
[facility] INPATIENT:  Family/Significant Other Suicide Prevention Education  Suicide Prevention Education:  Contact Attempts:  Tanya Nones [phone] (Girlfriend) has been identified by the patient as the family member/significant other with whom the patient will be residing, and identified as the person(s) who will aid the patient in the event of a mental health crisis.  With written consent from the patient, two attempts were made to provide suicide prevention education, prior to and/or following the patient's discharge.  We were unsuccessful in providing suicide prevention education.  A suicide education pamphlet was given to the patient to share with family/significant other.  Date and time of first attempt: 03/21/22 at 10:23am Date and time of second attempt: 03/22/2022 at 9:40am   CSW was unable to leave a voicemail.  The message on the phone states that the mailbox associated with that number is full.    [place] Kung Yony Roulston 03/22/2022, 9:48 AM

## 2022-03-22 NOTE — Progress Notes (Signed)
Patient discharged with all belongings and d/c instructions. Patient was given the opportunity to ask questions. No questions asked. 

## 2022-03-22 NOTE — Progress Notes (Signed)
   03/22/22 0547  Sleep  Number of Hours 6.75

## 2022-03-22 NOTE — Progress Notes (Signed)
    03/21/22 2100  Psych Admission Type (Psych Patients Only)  Admission Status Voluntary  Psychosocial Assessment  Patient Complaints None  Eye Contact Brief  Facial Expression Animated  Affect Appropriate to circumstance  Speech Logical/coherent  Interaction Assertive  Motor Activity Other (Comment) (WDL)  Appearance/Hygiene Unremarkable  Behavior Characteristics Cooperative  Mood Pleasant  Thought Process  Coherency WDL  Content WDL  Delusions None reported or observed  Perception WDL  Hallucination None reported or observed  Judgment WDL  Confusion None  Danger to Self  Current suicidal ideation? Denies  Agreement Not to Harm Self Yes  Description of Agreement verbal  Danger to Others  Danger to Others None reported or observed

## 2022-03-22 NOTE — BHH Suicide Risk Assessment (Addendum)
Suicide Risk Assessment  Discharge Assessment    [facility] Discharge Suicide Risk Assessment   Principal Problem: Suicide attempt [facility])  Discharge Diagnoses: Principal Problem:   Suicide attempt [facility]) Active Problems:   Observation following alleged suicide attempt  Total Time spent with patient:  Greater than 30 minutes  Musculoskeletal: Strength & Muscle Tone: within normal limits Gait & Station: normal Patient leans: N/A  Psychiatric Specialty Exam  Presentation  General Appearance:  Appropriate for Environment; Casual; Fairly Groomed  Eye Contact: Good  Speech: Clear and Coherent; Normal Rate  Speech Volume: Normal  Handedness: Right  Mood and Affect  Mood: Euthymic  Duration of Depression Symptoms: Less than two weeks  Affect: Appropriate; Congruent  Thought Process  Thought Processes: Coherent; Goal Directed; Linear  Descriptions of Associations:Intact  Orientation:Full (Time, Place and Person)  Thought Content:Logical  History of Schizophrenia/Schizoaffective disorder:No  Duration of Psychotic Symptoms:No data recorded Hallucinations:Hallucinations: None  Ideas of Reference:None  Suicidal Thoughts:Suicidal Thoughts: No  Homicidal Thoughts:Homicidal Thoughts: No  Sensorium  Memory: Immediate Good; Recent Good; Remote Good  Judgment: Good  Insight: Good  Executive Functions  Concentration: Good  Attention Span: Good  Recall: Good  Fund of Knowledge: Good  Language: Good  Psychomotor Activity  Psychomotor Activity: Psychomotor Activity: Normal  Assets  Assets: Communication Skills; Desire for Improvement; Financial Resources/Insurance; Housing; Physical Health; Resilience; Social Support; Vocational/Educational  Sleep  Sleep: Sleep: Good Number of Hours of Sleep: 6.75  Physical Exam:  Blood pressure (!) 147/94, pulse 76, temperature 97.8 F (36.6 C), temperature source Oral, resp. rate 16, height 6\' 3"  (1.905  m), weight 83.9 kg, SpO2 100 %. Body mass index is 23.12 kg/m.  Mental Status Per Nursing Assessment::   On Admission:  NA  Demographic Factors:  Male and Adolescent or young adult  Loss Factors: NA  Historical Factors: Impulsivity  Risk Reduction Factors:   Sense of responsibility to family, Religious beliefs about death, Living with another person, especially a relative, Positive social support, Positive therapeutic relationship, and Positive coping skills or problem solving skills  Continued Clinical Symptoms:  Alcohol/Substance Abuse/Dependencies Previous Psychiatric Diagnoses and Treatments  Cognitive Features That Contribute To Risk:  Closed-mindedness, Polarized thinking, and Thought constriction (tunnel vision)    Suicide Risk:  Minimal: No identifiable suicidal ideation.  Patients presenting with no risk factors but with morbid ruminations; may be classified as minimal risk based on the severity of the depressive symptoms   Follow-up Information     [facility] Follow up.   Specialty: Behavioral Health Why: You may go to this provider for an assessment, to obtain therapy and medication management services at the daily walk in time: Monday through Friday, arrive no later than 7:20 am. Services are provided on a first come, first served basis. Contact information: [address] [place] [phone]               Plan Of Care/Follow-up recommendations:  See discharge recommendation above.  [phone], NP, pmhnp, fnp-bc. 03/22/2022, 10:14 AM

## 2022-03-22 NOTE — Discharge Instructions (Signed)
-  Follow-up with your outpatient psychiatric provider -instructions on appointment date, time, and address (location) are provided to you in discharge paperwork.  -Follow-up with outpatient primary care doctor and other specialists -for management of preventative medicine and any chronic medical disease.  -Recommend abstinence from alcohol, tobacco, and other illicit drug use at discharge.   -If your psychiatric symptoms recur, worsen, or if you have side effects to your psychiatric medications, call your outpatient psychiatric provider, 911, [phone] or go to the nearest emergency department.  -If suicidal thoughts occur, call your outpatient psychiatric provider, 911, [phone] or go to the nearest emergency department.  Naloxone (Narcan) can help reverse an overdose when given to the victim quickly.  [facility] offers free naloxone kits and instructions/training on its use.  Add naloxone to your first aid kit and you can help save a life.   Pick up your free kit at the following locations:   [place]:  [place], [place] [address] [phone]) [facility] [place] [address] [phone]) [facility] [place]  [place]: [place] [address] [place] [address] [phone]) [facility] [place] [address] [phone])

## 2022-03-22 NOTE — Progress Notes (Signed)
  [facility] Adult Case Management Discharge Plan :  Will you be returning to the same living situation after discharge:  Yes,  Home with Girlfriend  At discharge, do you have transportation home?: Yes,  Girlfriend  Do you have the ability to pay for your medications: Yes,  Employment and Family   Release of information consent forms completed and in the chart;  Patient's signature needed at discharge.  Patient to Follow up at:  Follow-up Information     [facility] Follow up.   Specialty: Behavioral Health Why: You may go to this provider for an assessment, to obtain therapy and medication management services at the daily walk in time: Monday through Friday, arrive no later than 7:20 am. Services are provided on a first come, first served basis. Contact information: [address] [place] [address] [phone]                Next level of care provider has access to [facility] Link:yes  Safety Planning and Suicide Prevention discussed: Yes,  with patient and girlfriend      Has patient been referred to the Quitline?: N/A patient is not a smoker  Patient has been referred for addiction treatment: N/A  Aram Beecham, LCSWA 03/22/2022, 9:45 AM

## 2022-04-18 ENCOUNTER — Encounter (HOSPITAL_COMMUNITY): Payer: Self-pay

## 2022-04-18 ENCOUNTER — Other Ambulatory Visit: Payer: Self-pay

## 2022-04-18 ENCOUNTER — Emergency Department (HOSPITAL_COMMUNITY)
Admission: EM | Admit: 2022-04-18 | Discharge: 2022-04-19 | Disposition: A | Discharge status: Home / Self Care | Payer: Self-pay | Attending: Emergency Medicine | Admitting: Emergency Medicine

## 2022-04-18 DIAGNOSIS — R112 Nausea with vomiting, unspecified: Secondary | ICD-10-CM | POA: Insufficient documentation

## 2022-04-18 DIAGNOSIS — Z20822 Contact with and (suspected) exposure to covid-19: Secondary | ICD-10-CM | POA: Insufficient documentation

## 2022-04-18 DIAGNOSIS — J029 Acute pharyngitis, unspecified: Secondary | ICD-10-CM

## 2022-04-18 LAB — RESP PANEL BY RT-PCR (RSV, FLU A&B, COVID)  RVPGX2
Influenza A by PCR: NEGATIVE
Influenza B by PCR: NEGATIVE
Resp Syncytial Virus by PCR: NEGATIVE
SARS Coronavirus 2 by RT PCR: NEGATIVE

## 2022-04-18 NOTE — ED Provider Triage Note (Signed)
Emergency Medicine Provider Triage Evaluation Note  Roberto Buchanan , a 22 y.o. male  was evaluated in triage.  Pt complains of nausea, vomiting, cough, nasal congestion since yesterday.  Patient states he has been exposed to someone at work who is sick. No fever, chest pain or shortness of breath.  Review of Systems  Positive: As above Negative: As above  Physical Exam  BP 113/69 (BP Location: Left Arm)   Pulse 83   Temp 99.1 F (37.3 C) (Oral)   Resp 14   Ht 6\' 4"  (1.93 m)   Wt 104.3 kg   SpO2 99%   BMI 28.00 kg/m  Gen:   Awake, no distress   Resp:  Normal effort  MSK:   Moves extremities without difficulty  Other:    Medical Decision Making  Medically screening exam initiated at 11:44 PM.  Appropriate orders placed.  Roberto Buchanan was informed that the remainder of the evaluation will be completed by another provider, this initial triage assessment does not replace that evaluation, and the importance of remaining in the ED until their evaluation is complete.     Rex Kras, [place] 04/19/22 0140

## 2022-04-18 NOTE — ED Triage Notes (Signed)
Pt states that some ppl at work were sick. Pt is complaining of abdominal pain and vomiting since yesterday.

## 2022-04-19 LAB — GROUP A STREP BY PCR: Group A Strep by PCR: NOT DETECTED

## 2022-04-19 MED ORDER — ONDANSETRON 4 MG PO TBDP: 4.0000 mg | ORAL_TABLET | Freq: Three times a day (TID) | ORAL | 0 refills | Status: DC | PRN

## 2022-04-19 MED ORDER — ONDANSETRON 4 MG PO TBDP
4.0000 mg | ORAL_TABLET | Freq: Once | ORAL | Status: AC
  Administered 2022-04-19: 4 mg via ORAL
  Filled 2022-04-19: qty 1

## 2022-04-19 NOTE — Discharge Instructions (Signed)
You were seen in the ER today for nausea vomiting and sore throat.  You likely have a virus causing all of your symptoms.  You tested negative for strep throat, COVID, and the flu today.  Please increase your hydration at home, you may use the prescribed nausea medication as needed every 8 hours.  You may use over-the-counter medication such as Tylenol or ibuprofen as needed for your symptoms.  Follow-up with your primary care doctor and return to the ER with any severe symptoms.

## 2022-04-19 NOTE — ED Provider Notes (Signed)
[place] DEPT Provider Note   CSN: [id number] Arrival date & time: 04/18/22  2254     History  Chief Complaint  Patient presents with   Abdominal Pain   Emesis    Roberto Buchanan is a 22 y.o. male who presents with sore throat mild abdominal pain and forms of NBNB emesis with nausea that started yesterday after exposure to colleagues with similar symptoms.  No diarrhea, but subjective fevers chills.  No rash.  Did take some ibuprofen prior to arrival with minimal improvement in symptoms.  I personally viewed patient's medical records.  He does have recent suicide attempt in December 2023.  Denies any suicidality today, presents with cough from the bedside. HPI     Home Medications Prior to Admission medications   Medication Sig Start Date End Date Taking? Authorizing Provider  ondansetron (ZOFRAN-ODT) 4 MG disintegrating tablet Take 1 tablet (4 mg total) by mouth every 8 (eight) hours as needed for nausea or vomiting. 04/19/22  Yes Landynn Dupler R, PA-C      Allergies    Other    Review of Systems   Review of Systems  Constitutional:  Positive for chills, fatigue and fever. Negative for activity change and appetite change.  HENT:  Positive for congestion and sore throat. Negative for dental problem, drooling, sinus pressure, sinus pain, sneezing, tinnitus, trouble swallowing and voice change.   Respiratory:  Positive for cough.   Gastrointestinal:  Positive for abdominal pain, nausea and vomiting. Negative for diarrhea.  Genitourinary: Negative.   Musculoskeletal: Negative.   Neurological: Negative.     Physical Exam Updated Vital Signs BP 123/72   Pulse 88   Temp 99 F (37.2 C) (Oral)   Resp 18   Ht 6\' 4"  (1.93 m)   Wt 104.3 kg   SpO2 99%   BMI 28.00 kg/m  Physical Exam Vitals and nursing note reviewed.  Constitutional:      Appearance: He is not ill-appearing or toxic-appearing.  HENT:     Head: Normocephalic and  atraumatic.     Mouth/Throat:     Mouth: Mucous membranes are moist.     Pharynx: Oropharynx is clear. Uvula midline. Posterior oropharyngeal erythema present. No oropharyngeal exudate.     Tonsils: Tonsillar exudate present. 3+ on the right. 2+ on the left.  Eyes:     General:        Right eye: No discharge.        Left eye: No discharge.     Conjunctiva/sclera: Conjunctivae normal.  Cardiovascular:     Rate and Rhythm: Normal rate and regular rhythm.     Pulses: Normal pulses.     Heart sounds: Normal heart sounds. No murmur heard. Pulmonary:     Effort: Pulmonary effort is normal. No respiratory distress.     Breath sounds: Normal breath sounds. No wheezing or rales.  Abdominal:     General: Bowel sounds are normal. There is no distension.     Palpations: Abdomen is soft.     Tenderness: There is no abdominal tenderness. There is no right CVA tenderness, left CVA tenderness, guarding or rebound.  Musculoskeletal:        General: No deformity.     Cervical back: Neck supple.  Skin:    General: Skin is warm and dry.     Capillary Refill: Capillary refill takes less than 2 seconds.  Neurological:     General: No focal deficit present.     Mental  Status: He is alert. Mental status is at baseline.  Psychiatric:        Mood and Affect: Mood normal.     ED Results / Procedures / Treatments   Labs (all labs ordered are listed, but only abnormal results are displayed) Labs Reviewed  RESP PANEL BY RT-PCR (RSV, FLU A&B, COVID)  RVPGX2  GROUP A STREP BY PCR    EKG None  Radiology No results found.  Procedures Procedures    Medications Ordered in ED Medications  ondansetron (ZOFRAN-ODT) disintegrating tablet 4 mg (4 mg Oral Given 04/19/22 0356)    ED Course/ Medical Decision Making/ A&P                           Medical Decision Making 22 year old male with flulike symptoms after exposure to ill coworkers.  Vital signs are normal on intake.  Cardiopulmonary and  abdominal exams are benign.  Oropharyngeal exam as above with findings concerning for bilateral tonsillar edema and exudate and posterior pharyngeal erythema.  Patient offered laboratory studies and IV rehydration, patient prefers to proceed with ODT Zofran and oral rehydration therapy, feel this is reasonable given reassuring exam and normal vital signs.  Amount and/or Complexity of Data Reviewed Labs:     Details: RVP negative.  Strep test negative  Risk Prescription drug management.   Clinical picture most consistent with acute viral etiology for patient's symptomatology.  Recommend oral rehydration therapy at home, Tylenol and ibuprofen as needed for symptom management.  Clinical concern for emergent underlying etiology that warrant further ED workup or inpatient management is exceedingly low.  Rudra and his partner voiced understanding of his medical evaluation and treatment plan. Each of their questions answered to their expressed satisfaction.  Return precautions were given.  Patient is well-appearing, stable, and was discharged in good condition.  This chart was dictated using voice recognition software, Dragon. Despite the best efforts of this provider to proofread and correct errors, errors may still occur which can change documentation meaning.  Final Clinical Impression(s) / ED Diagnoses Final diagnoses:  Nausea and vomiting, unspecified vomiting type    Rx / DC Orders ED Discharge Orders          Ordered    ondansetron (ZOFRAN-ODT) 4 MG disintegrating tablet  Every 8 hours PRN        04/19/22 0339              Yuvraj Pfeifer, Gypsy Balsam, PA-C 04/19/22 0438    Fatima Blank, MD 04/19/22 1946

## 2022-04-25 ENCOUNTER — Emergency Department (HOSPITAL_BASED_OUTPATIENT_CLINIC_OR_DEPARTMENT_OTHER)
Admission: EM | Admit: 2022-04-25 | Discharge: 2022-04-26 | Disposition: A | Discharge status: Home / Self Care | Payer: Self-pay | Attending: Emergency Medicine | Admitting: Emergency Medicine

## 2022-04-25 ENCOUNTER — Other Ambulatory Visit: Payer: Self-pay

## 2022-04-25 DIAGNOSIS — R112 Nausea with vomiting, unspecified: Secondary | ICD-10-CM | POA: Insufficient documentation

## 2022-04-25 DIAGNOSIS — K529 Noninfective gastroenteritis and colitis, unspecified: Secondary | ICD-10-CM

## 2022-04-25 LAB — CBC
HCT: 47.4 % (ref 39.0–52.0)
Hemoglobin: 16 g/dL (ref 13.0–17.0)
MCH: 28.5 pg (ref 26.0–34.0)
MCHC: 33.8 g/dL (ref 30.0–36.0)
MCV: 84.5 fL (ref 80.0–100.0)
Platelets: 308 10*3/uL (ref 150–400)
RBC: 5.61 MIL/uL (ref 4.22–5.81)
RDW: 12.8 % (ref 11.5–15.5)
WBC: 7.5 10*3/uL (ref 4.0–10.5)
nRBC: 0 % (ref 0.0–0.2)

## 2022-04-25 MED ORDER — KETOROLAC TROMETHAMINE 30 MG/ML IJ SOLN
30.0000 mg | Freq: Once | INTRAMUSCULAR | Status: AC
  Administered 2022-04-26: 30 mg via INTRAVENOUS
  Filled 2022-04-25: qty 1

## 2022-04-25 MED ORDER — SODIUM CHLORIDE 0.9 % IV BOLUS
1000.0000 mL | Freq: Once | INTRAVENOUS | Status: AC
  Administered 2022-04-26: 1000 mL via INTRAVENOUS

## 2022-04-25 MED ORDER — ONDANSETRON HCL 4 MG/2ML IJ SOLN
4.0000 mg | Freq: Once | INTRAMUSCULAR | Status: AC
  Administered 2022-04-26: 4 mg via INTRAVENOUS
  Filled 2022-04-25: qty 2

## 2022-04-25 NOTE — ED Triage Notes (Signed)
Patient arrived via POV c/o 1 episode of emesis w/ blood, nausea and cough x 7 days. Patient seen previously for similar. Patient states "still feels bad". Patient states nausea at this time. Patient is AO x 4 VS WDL, normal gait.

## 2022-04-25 NOTE — ED Provider Notes (Signed)
[place] EMERGENCY DEPARTMENT Provider Note   CSN: [id number] Arrival date & time: 04/25/22  2313     History  Chief Complaint  Patient presents with   Emesis   Cough   Nausea    Roberto Buchanan is a 22 y.o. male.  Patient is a 22 year old male with past medical history of ADHD, prior appendectomy.  Patient presenting today with complaints of nausea and vomiting.  He was seen last week with similar complaints.  He was excused from work for several days, then tried to go this evening.  While he was brushing his teeth, he again became nauseated and started vomiting.  This time he noticed bright red blood in his emesis.  He denies to me that he is having any abdominal pain, black or bloody stools.  He denies any fevers or chills.  He denies ill contacts.  The history is provided by the patient.       Home Medications Prior to Admission medications   Medication Sig Start Date End Date Taking? Authorizing Provider  ondansetron (ZOFRAN-ODT) 4 MG disintegrating tablet Take 1 tablet (4 mg total) by mouth every 8 (eight) hours as needed for nausea or vomiting. 04/19/22   Sponseller, Gypsy Balsam, PA-C      Allergies    Other    Review of Systems   Review of Systems  All other systems reviewed and are negative.   Physical Exam Updated Vital Signs BP 121/72 (BP Location: Left Arm)   Pulse 81   Temp 97.8 F (36.6 C) (Oral)   Resp 18   Ht 6\' 4"  (1.93 m)   Wt 104.3 kg   SpO2 100%   BMI 28.00 kg/m  Physical Exam Vitals and nursing note reviewed.  Constitutional:      General: He is not in acute distress.    Appearance: He is well-developed. He is not diaphoretic.  HENT:     Head: Normocephalic and atraumatic.  Cardiovascular:     Rate and Rhythm: Normal rate and regular rhythm.     Heart sounds: No murmur heard.    No friction rub.  Pulmonary:     Effort: Pulmonary effort is normal. No respiratory distress.     Breath sounds: Normal breath sounds. No  wheezing or rales.  Abdominal:     General: Bowel sounds are normal. There is no distension.     Palpations: Abdomen is soft.     Tenderness: There is no abdominal tenderness.  Musculoskeletal:        General: Normal range of motion.     Cervical back: Normal range of motion and neck supple.  Skin:    General: Skin is warm and dry.  Neurological:     Mental Status: He is alert and oriented to person, place, and time.     Coordination: Coordination normal.     ED Results / Procedures / Treatments   Labs (all labs ordered are listed, but only abnormal results are displayed) Labs Reviewed  CBC  LIPASE, BLOOD  COMPREHENSIVE METABOLIC PANEL  URINALYSIS, ROUTINE W REFLEX MICROSCOPIC    EKG None  Radiology No results found.  Procedures Procedures    Medications Ordered in ED Medications - No data to display  ED Course/ Medical Decision Making/ A&P  Patient is a 22 year old male presenting with vomiting and hematemesis as described in the HPI.  He arrives here with stable vital signs and is afebrile.  Physical examination reveals a benign abdomen and patient appears  well-hydrated.  Workup initiated including CBC, CMP, lipase, all of which were unremarkable.  Patient has been hydrated with normal saline, given Zofran for nausea and Toradol for pain.  He seems to be feeling better.  At this point, I feels the patient can safely be discharged with outpatient follow-up.  I highly suspect this to be a viral gastroenteritis with possibly a Mallory-Weiss tear.  He has had no further hematemesis while here and is having no melena.  His hemoglobin is 16.  Final Clinical Impression(s) / ED Diagnoses Final diagnoses:  None    Rx / DC Orders ED Discharge Orders     None         Veryl Speak, MD 04/26/22 [phone]

## 2022-04-26 LAB — COMPREHENSIVE METABOLIC PANEL
ALT: 17 U/L (ref 0–44)
AST: 22 U/L (ref 15–41)
Albumin: 4 g/dL (ref 3.5–5.0)
Alkaline Phosphatase: 52 U/L (ref 38–126)
Anion gap: 7 (ref 5–15)
BUN: 12 mg/dL (ref 6–20)
CO2: 29 mmol/L (ref 22–32)
Calcium: 8.9 mg/dL (ref 8.9–10.3)
Chloride: 101 mmol/L (ref 98–111)
Creatinine, Ser: 0.9 mg/dL (ref 0.61–1.24)
GFR, Estimated: >60 mL/min (ref >60)
Glucose, Bld: 114 mg/dL — ABNORMAL HIGH (ref 70–99)
Potassium: 4.2 mmol/L (ref 3.5–5.1)
Sodium: 137 mmol/L (ref 135–145)
Total Bilirubin: 0.4 mg/dL (ref 0.3–1.2)
Total Protein: 8.1 g/dL (ref 6.5–8.1)

## 2022-04-26 LAB — LIPASE, BLOOD: Lipase: 37 U/L (ref 11–51)

## 2022-04-26 MED ORDER — ONDANSETRON 8 MG PO TBDP: ORAL_TABLET | ORAL | 1 refills | Status: DC

## 2022-04-26 NOTE — Discharge Instructions (Signed)
Begin taking Zofran as prescribed as needed for nausea.  Clear liquids for the next 12 hours, then slowly advance to normal as tolerated.  Return to the emergency department if you develop severe abdominal pain, high fevers, bloody stools, or for other new and concerning symptoms.

## 2023-06-18 ENCOUNTER — Emergency Department (HOSPITAL_COMMUNITY)
Admission: EM | Admit: 2023-06-18 | Discharge: 2023-06-18 | Discharge status: Against Medical Advice | Payer: Self-pay | Attending: Emergency Medicine | Admitting: Emergency Medicine

## 2023-06-18 ENCOUNTER — Encounter (HOSPITAL_COMMUNITY): Payer: Self-pay

## 2023-06-18 ENCOUNTER — Other Ambulatory Visit: Payer: Self-pay

## 2023-06-18 DIAGNOSIS — R059 Cough, unspecified: Secondary | ICD-10-CM | POA: Insufficient documentation

## 2023-06-18 DIAGNOSIS — Z5321 Procedure and treatment not carried out due to patient leaving prior to being seen by health care provider: Secondary | ICD-10-CM | POA: Insufficient documentation

## 2023-06-18 DIAGNOSIS — R0602 Shortness of breath: Secondary | ICD-10-CM | POA: Insufficient documentation

## 2023-06-18 NOTE — ED Triage Notes (Signed)
 Pt reports with cough and shob for a long time. Pt feels that he has asthma.

## 2023-06-24 ENCOUNTER — Encounter (HOSPITAL_COMMUNITY): Payer: Self-pay

## 2023-06-24 ENCOUNTER — Other Ambulatory Visit: Payer: Self-pay

## 2023-06-24 ENCOUNTER — Emergency Department (HOSPITAL_COMMUNITY)
Admission: EM | Admit: 2023-06-24 | Discharge: 2023-06-24 | Disposition: A | Discharge status: Home / Self Care | Payer: Self-pay | Attending: Emergency Medicine | Admitting: Emergency Medicine

## 2023-06-24 ENCOUNTER — Emergency Department (HOSPITAL_COMMUNITY): Payer: Self-pay

## 2023-06-24 DIAGNOSIS — R55 Syncope and collapse: Secondary | ICD-10-CM | POA: Insufficient documentation

## 2023-06-24 DIAGNOSIS — R0602 Shortness of breath: Secondary | ICD-10-CM | POA: Insufficient documentation

## 2023-06-24 DIAGNOSIS — W19XXXA Unspecified fall, initial encounter: Secondary | ICD-10-CM | POA: Insufficient documentation

## 2023-06-24 DIAGNOSIS — Y9301 Activity, walking, marching and hiking: Secondary | ICD-10-CM | POA: Insufficient documentation

## 2023-06-24 DIAGNOSIS — S20212A Contusion of left front wall of thorax, initial encounter: Secondary | ICD-10-CM | POA: Insufficient documentation

## 2023-06-24 LAB — TROPONIN I (HIGH SENSITIVITY)
Troponin I (High Sensitivity): <2 ng/L (ref <18)
Troponin I (High Sensitivity): <2 ng/L (ref <18)

## 2023-06-24 LAB — URINALYSIS, ROUTINE W REFLEX MICROSCOPIC
Bilirubin Urine: NEGATIVE
Glucose, UA: NEGATIVE mg/dL
Hgb urine dipstick: NEGATIVE
Ketones, ur: NEGATIVE mg/dL
Leukocytes,Ua: NEGATIVE
Nitrite: NEGATIVE
Protein, ur: NEGATIVE mg/dL
Specific Gravity, Urine: 1.021 (ref 1.005–1.030)
pH: 5 (ref 5.0–8.0)

## 2023-06-24 LAB — CBC
HCT: 44.7 % (ref 39.0–52.0)
Hemoglobin: 15.1 g/dL (ref 13.0–17.0)
MCH: 29.1 pg (ref 26.0–34.0)
MCHC: 33.8 g/dL (ref 30.0–36.0)
MCV: 86.1 fL (ref 80.0–100.0)
Platelets: 238 10*3/uL (ref 150–400)
RBC: 5.19 MIL/uL (ref 4.22–5.81)
RDW: 13.2 % (ref 11.5–15.5)
WBC: 5.4 10*3/uL (ref 4.0–10.5)
nRBC: 0 % (ref 0.0–0.2)

## 2023-06-24 LAB — CBG MONITORING, ED: Glucose-Capillary: 95 mg/dL (ref 70–99)

## 2023-06-24 LAB — BASIC METABOLIC PANEL
Anion gap: 7 (ref 5–15)
BUN: 12 mg/dL (ref 6–20)
CO2: 24 mmol/L (ref 22–32)
Calcium: 8.5 mg/dL — ABNORMAL LOW (ref 8.9–10.3)
Chloride: 107 mmol/L (ref 98–111)
Creatinine, Ser: 0.82 mg/dL (ref 0.61–1.24)
GFR, Estimated: >60 mL/min (ref >60)
Glucose, Bld: 99 mg/dL (ref 70–99)
Potassium: 3.6 mmol/L (ref 3.5–5.1)
Sodium: 138 mmol/L (ref 135–145)

## 2023-06-24 MED ORDER — NAPROXEN 500 MG PO TABS
500.0000 mg | ORAL_TABLET | Freq: Once | ORAL | Status: AC
  Administered 2023-06-24: 500 mg via ORAL
  Filled 2023-06-24: qty 1

## 2023-06-24 MED ORDER — LIDOCAINE 5 % EX PTCH
1.0000 | MEDICATED_PATCH | CUTANEOUS | Status: DC
  Administered 2023-06-24: 1 via TRANSDERMAL
  Filled 2023-06-24: qty 1

## 2023-06-24 MED ORDER — NAPROXEN 500 MG PO TABS: 500.0000 mg | ORAL_TABLET | Freq: Two times a day (BID) | ORAL | 0 refills | Status: AC

## 2023-06-24 MED ORDER — MORPHINE SULFATE (PF) 4 MG/ML IV SOLN
8.0000 mg | Freq: Once | INTRAVENOUS | Status: AC
  Administered 2023-06-24: 8 mg via INTRAVENOUS
  Filled 2023-06-24: qty 2

## 2023-06-24 NOTE — ED Triage Notes (Signed)
 Pt arrived via POV. C/o syncopal event this AM when walking to the bathroom. Woke up w/L sided chest pain and period of SOB.  No cadiac hx.

## 2023-06-24 NOTE — ED Notes (Signed)
Pt ambulatory to bathroom w/o assist. Steady gait. No complaints

## 2023-06-24 NOTE — ED Provider Notes (Signed)
 [place] EMERGENCY DEPARTMENT AT [facility] Provider Note   CSN: [id number] Arrival date & time: 06/24/23  0732     History  Chief Complaint  Patient presents with   Loss of Consciousness   Chest Pain    Roberto Buchanan is a 23 y.o. male.  HPI    23 year old male comes in with chief complaint of chest pain, fainting.  Patient indicates that he woke up this morning, he was going to the bathroom, Remembers he got dizzy and then he fainted.  He found himself on the floor.  He thinks that he fell on the left side and is having some left-sided chest pain.  Patient also was experiencing some shortness of breath.  He has no history of anxiety.  Patient denies any history of CHF, asthma or any cardiac disease history.  No history of PE, DVT.  No blood loss.  Home Medications Prior to Admission medications   Medication Sig Start Date End Date Taking? Authorizing Provider  EPINEPHrine (PRIMATENE MIST) 0.125 MG/ACT AERO Inhale 1-2 puffs into the lungs as needed (wheezing/SOB).   Yes [provider]  naproxen (NAPROSYN) 500 MG tablet Take 1 tablet (500 mg total) by mouth 2 (two) times daily. 06/24/23  Yes Derwood Kaplan, MD      Allergies    Other    Review of Systems   Review of Systems  All other systems reviewed and are negative.   Physical Exam Updated Vital Signs BP 125/83   Pulse 84   Temp 98.7 F (37.1 C) (Oral)   Resp 17   Ht 6\' 4"  (1.93 m)   Wt 95.3 kg   SpO2 98%   BMI 25.56 kg/m  Physical Exam Vitals and nursing note reviewed.  Constitutional:      Appearance: He is well-developed.  HENT:     Head: Atraumatic.  Cardiovascular:     Rate and Rhythm: Normal rate.     Heart sounds: Normal heart sounds.  Pulmonary:     Effort: Pulmonary effort is normal.  Musculoskeletal:     Cervical back: Neck supple.  Skin:    General: Skin is warm.  Neurological:     Mental Status: He is alert and oriented to person, place, and time.     ED  Results / Procedures / Treatments   Labs (all labs ordered are listed, but only abnormal results are displayed) Labs Reviewed  BASIC METABOLIC PANEL - Abnormal; Notable for the following components:      Result Value   Calcium 8.5 (*)    All other components within normal limits  CBC  URINALYSIS, ROUTINE W REFLEX MICROSCOPIC  CBG MONITORING, ED  TROPONIN I (HIGH SENSITIVITY)  TROPONIN I (HIGH SENSITIVITY)    EKG EKG Interpretation Date/Time:  Monday June 24 2023 07:46:58 EDT Ventricular Rate:  84 PR Interval:  144 QRS Duration:  104 QT Interval:  374 QTC Calculation: 443 R Axis:   41  Text Interpretation: Sinus rhythm RSR' in V1 or V2, probably normal variant ST elev, probable normal early repol pattern No acute changes No significant change since last tracing Confirmed by Derwood Kaplan ([id number]) on 06/24/2023 8:07:55 AM  Radiology DG Chest Port 1 View Result Date: 06/24/2023 CLINICAL DATA:  Shortness of breath.  Left chest pain. EXAM: PORTABLE CHEST 1 VIEW COMPARISON:  02/02/2018. FINDINGS: Bilateral lung fields are clear. Bilateral costophrenic angles are clear. Normal cardio-mediastinal silhouette. No acute osseous abnormalities. The soft tissues are within normal limits. IMPRESSION:  No active disease. Electronically Signed   By: Jules Schick M.D.   On: 06/24/2023 09:49    Procedures Procedures    Medications Ordered in ED Medications  lidocaine (LIDODERM) 5 % 1 patch (1 patch Transdermal Patch Applied 06/24/23 1055)  naproxen (NAPROSYN) tablet 500 mg (500 mg Oral Given 06/24/23 1055)  morphine (PF) 4 MG/ML injection 8 mg (8 mg Intravenous Given 06/24/23 1118)    ED Course/ Medical Decision Making/ A&P                                 Medical Decision Making Amount and/or Complexity of Data Reviewed Labs: ordered. Radiology: ordered.  Risk Prescription drug management.   23 year old patient comes in with chief complaint of syncope and fall.  Patient is also  having left-sided chest pain now.  Differential diagnosis for this patient includes: Orthostatic hypotension Stroke Vertebral artery dissection/stenosis Dysrhythmia PE Vasovagal/neurocardiogenic syncope Valvular disorder/Cardiomyopathy Anemia  Patient is also having chest pain.  Suspect that most likely this could be because of chest wall contusion.  Other possibility include PE, pneumothorax, rib fracture.  Basic labs ordered, x-ray ordered. I independently.  Patient's chest x-ray, no evidence of pneumothorax.  Patient has no wheezing which is reassuring.  Reassessment: Patient is PERC negative.  On cardiac telemetry there has not been any arrhythmia.  His blood work is reassuring. EKG showed noes concerning finding.  We will discharge him with likely vasovagal syncope as a diagnosis and chest wall contusion that occurred because of fall.  Final Clinical Impression(s) / ED Diagnoses Final diagnoses:  Vasovagal syncope  Chest wall contusion, left, initial encounter    Rx / DC Orders ED Discharge Orders          Ordered    naproxen (NAPROSYN) 500 MG tablet  2 times daily        06/24/23 1124              Derwood Kaplan, MD 06/24/23 1604

## 2023-06-24 NOTE — Discharge Instructions (Addendum)
 We suspect that most likely you have chest wall contusion.  We also suspect that your fainting was likely because of vasovagal episode.  Please take ibuprofen. Please follow-up with [facility], call the number provided for follow-up.

## 2023-08-06 ENCOUNTER — Emergency Department (HOSPITAL_COMMUNITY)
Admission: EM | Admit: 2023-08-06 | Discharge: 2023-08-06 | Disposition: A | Discharge status: Home / Self Care | Payer: Self-pay | Attending: Emergency Medicine | Admitting: Emergency Medicine

## 2023-08-06 ENCOUNTER — Other Ambulatory Visit: Payer: Self-pay

## 2023-08-06 ENCOUNTER — Encounter (HOSPITAL_COMMUNITY): Payer: Self-pay

## 2023-08-06 DIAGNOSIS — J3489 Other specified disorders of nose and nasal sinuses: Secondary | ICD-10-CM | POA: Insufficient documentation

## 2023-08-06 DIAGNOSIS — J069 Acute upper respiratory infection, unspecified: Secondary | ICD-10-CM

## 2023-08-06 DIAGNOSIS — R0981 Nasal congestion: Secondary | ICD-10-CM | POA: Insufficient documentation

## 2023-08-06 DIAGNOSIS — R059 Cough, unspecified: Secondary | ICD-10-CM | POA: Insufficient documentation

## 2023-08-06 DIAGNOSIS — J029 Acute pharyngitis, unspecified: Secondary | ICD-10-CM | POA: Insufficient documentation

## 2023-08-06 DIAGNOSIS — R490 Dysphonia: Secondary | ICD-10-CM | POA: Insufficient documentation

## 2023-08-06 LAB — GROUP A STREP BY PCR: Group A Strep by PCR: NOT DETECTED

## 2023-08-06 LAB — RESP PANEL BY RT-PCR (RSV, FLU A&B, COVID)  RVPGX2
Influenza A by PCR: NEGATIVE
Influenza B by PCR: NEGATIVE
Resp Syncytial Virus by PCR: NEGATIVE
SARS Coronavirus 2 by RT PCR: NEGATIVE

## 2023-08-06 NOTE — ED Provider Notes (Signed)
 [place] EMERGENCY DEPARTMENT AT [facility] Provider Note   CSN: [id number] Arrival date & time: 08/06/23  1601     History Chief Complaint  Patient presents with   Cough     Cough Associated symptoms: no chest pain, no chills, no ear pain, no fever, no rash, no shortness of breath and no sore throat    Roberto Buchanan is a 23 y.o. male presenting for a 1 week history of cough, runny nose, and sore throat.  He states that last week he did feel like he had bodyaches and chills, did not check his temperature but did state he felt like he had a fever though this is not occurred on the last several days.  He says today his primary concern is that he is hoarse.  Continues to have rhinorrhea, as well as occasionally productive cough with clear sputum produced..   Patient's recorded medical, surgical, social, medication list and allergies were reviewed in the Snapshot window as part of the initial history.   Review of Systems   Review of Systems  Constitutional:  Negative for chills and fever.  HENT:  Negative for ear pain and sore throat.   Eyes:  Negative for pain and visual disturbance.  Respiratory:  Positive for cough. Negative for shortness of breath.   Cardiovascular:  Negative for chest pain and palpitations.  Gastrointestinal:  Negative for abdominal pain and vomiting.  Genitourinary:  Negative for dysuria and hematuria.  Musculoskeletal:  Negative for arthralgias and back pain.  Skin:  Negative for color change and rash.  Neurological:  Negative for seizures and syncope.  All other systems reviewed and are negative.   Physical Exam Updated Vital Signs BP (!) 154/93 (BP Location: Left Arm)   Pulse 81   Temp 98.8 F (37.1 C) (Oral)   Resp 18   Ht 6\' 4"  (1.93 m)   Wt 95.3 kg   SpO2 99%   BMI 25.56 kg/m  Physical Exam Vitals and nursing note reviewed.  Constitutional:      General: He is not in acute distress.    Appearance: Normal appearance.   HENT:     Head: Normocephalic and atraumatic.     Right Ear: Tympanic membrane, ear canal and external ear normal.     Left Ear: Tympanic membrane, ear canal and external ear normal.     Nose: Congestion present.     Mouth/Throat:     Mouth: Mucous membranes are moist.     Pharynx: Pharyngeal swelling, posterior oropharyngeal erythema and postnasal drip present. No oropharyngeal exudate.     Tonsils: No tonsillar exudate or tonsillar abscesses.  Eyes:     Extraocular Movements: Extraocular movements intact.     Conjunctiva/sclera: Conjunctivae normal.     Pupils: Pupils are equal, round, and reactive to light.  Cardiovascular:     Rate and Rhythm: Normal rate and regular rhythm.     Pulses: Normal pulses.     Heart sounds: Normal heart sounds. No murmur heard.    No friction rub. No gallop.  Pulmonary:     Effort: Pulmonary effort is normal.     Breath sounds: No wheezing, rhonchi or rales.  Abdominal:     General: Abdomen is flat. Bowel sounds are normal.     Palpations: Abdomen is soft.  Musculoskeletal:        General: Normal range of motion.     Cervical back: Normal range of motion and neck supple.  Right lower leg: No edema.     Left lower leg: No edema.  Skin:    General: Skin is warm and dry.     Capillary Refill: Capillary refill takes less than 2 seconds.  Neurological:     General: No focal deficit present.     Mental Status: He is alert. Mental status is at baseline.  Psychiatric:        Mood and Affect: Mood normal.      ED Course/ Medical Decision Making/ A&P    Procedures Procedures   Medications Ordered in ED Medications - No data to display  Medical Decision Making:   Roberto Buchanan is a 23 y.o. male who presented to the ED today with hoarseness and cough detailed above.     Complete initial physical exam performed, notably the patient  was noticeably hoarse, and had erythema and edema of the posterior oropharynx without any exudate noted  on either tonsil. .    Reviewed and confirmed nursing documentation for past medical history, family history, social history.    Initial Assessment:   With the patient's presentation of hoarseness and cough, most likely diagnosis is viral URI. Other diagnoses were considered including (but not limited to) streptococcal pharyngitis, influenza. These are considered less likely due to history of present illness and physical exam findings.     Initial Plan:  Nasopharyngeal swabs obtained for COVID and flu Strep swab obtained to rule out streptococcal pharyngitis Objective evaluation as below reviewed   Initial Study Results:   Laboratory  All laboratory results reviewed without evidence of clinically relevant pathology.   Exceptions include: None   Reassessment and Plan:   In review obtained swabs, and negative for strep, negative for COVID or influenza.  Suspect other viral URI along with potential for allergic rhinitis.  Will have patient do warm salt water gargles, utilize over-the-counter cough and cold medications as needed for symptom relief, and follow-up with primary care as needed for continued management.     Clinical Impression: No diagnosis found.   Data Unavailable   Final Clinical Impression(s) / ED Diagnoses Final diagnoses:  None    Rx / DC Orders ED Discharge Orders     None         Juanetta Nordmann, PA 08/06/23 6295    Deatra Face, MD 08/06/23 [id number]

## 2023-08-06 NOTE — Discharge Instructions (Signed)
 Gus, you can use over-the-counter cough and cold remedies as needed for your symptoms.  All testing indicates that you do not have strep throat, and you do not have influenza nor COVID.  If your symptoms fail to improve over the next several days, would encourage you to follow-up with your primary care for continued management.  You can also use warm salt water gargles, and continue to use your Flonase and over-the-counter antihistamines to manage your symptoms.

## 2023-08-06 NOTE — ED Triage Notes (Signed)
 Patient presented to ER with sore throat and cough. Patient complains of losing his voice, no fever, has not been around anyone who is sick.

## 2023-10-28 ENCOUNTER — Emergency Department (HOSPITAL_COMMUNITY): Payer: Self-pay

## 2023-10-28 ENCOUNTER — Emergency Department (HOSPITAL_COMMUNITY)
Admission: EM | Admit: 2023-10-28 | Discharge: 2023-10-28 | Disposition: A | Discharge status: Home / Self Care | Payer: Self-pay | Attending: Emergency Medicine | Admitting: Emergency Medicine

## 2023-10-28 ENCOUNTER — Encounter (HOSPITAL_COMMUNITY): Payer: Self-pay

## 2023-10-28 DIAGNOSIS — I809 Phlebitis and thrombophlebitis of unspecified site: Secondary | ICD-10-CM | POA: Insufficient documentation

## 2023-10-28 DIAGNOSIS — R233 Spontaneous ecchymoses: Secondary | ICD-10-CM

## 2023-10-28 LAB — BASIC METABOLIC PANEL WITH GFR
Anion gap: 7 (ref 5–15)
BUN: 9 mg/dL (ref 6–20)
CO2: 28 mmol/L (ref 22–32)
Calcium: 9.2 mg/dL (ref 8.9–10.3)
Chloride: 103 mmol/L (ref 98–111)
Creatinine, Ser: 0.8 mg/dL (ref 0.61–1.24)
GFR, Estimated: >60 mL/min (ref >60)
Glucose, Bld: 95 mg/dL (ref 70–99)
Potassium: 4.6 mmol/L (ref 3.5–5.1)
Sodium: 138 mmol/L (ref 135–145)

## 2023-10-28 LAB — CBC
HCT: 50.3 % (ref 39.0–52.0)
Hemoglobin: 16.7 g/dL (ref 13.0–17.0)
MCH: 28.8 pg (ref 26.0–34.0)
MCHC: 33.2 g/dL (ref 30.0–36.0)
MCV: 86.7 fL (ref 80.0–100.0)
Platelets: 256 K/uL (ref 150–400)
RBC: 5.8 MIL/uL (ref 4.22–5.81)
RDW: 13.2 % (ref 11.5–15.5)
WBC: 4.7 K/uL (ref 4.0–10.5)
nRBC: 0 % (ref 0.0–0.2)

## 2023-10-28 LAB — PROTIME-INR
INR: 0.9 (ref 0.8–1.2)
Prothrombin Time: 12.8 s (ref 11.4–15.2)

## 2023-10-28 LAB — APTT: aPTT: 25 s (ref 24–36)

## 2023-10-28 NOTE — Discharge Instructions (Signed)
 With your otherwise reassuring workup I am suspicious that you have something called thrombophlebitis which is some inflammation and breakdown of blood vessels.  It may cause some bruising and soreness like you have experienced, I would treat conservatively by placing some warm compresses to the affected area, and monitoring to ensure that it is not significantly worsening, if it does seem like it is getting a lot worse, more painful, you develop a fever greater than 100.4 by thermometer I recommend returning for further evaluation.

## 2023-10-28 NOTE — ED Triage Notes (Signed)
 Pt has bruising and redness on his left shoulder, states it was due to donating plasma this week on Saturday. States he has dizziness and lightheadedness starting Saturday.

## 2023-10-28 NOTE — ED Provider Notes (Signed)
 [name] EMERGENCY DEPARTMENT AT [facility] Provider Note   CSN: [id number] Arrival date & time: 10/28/23  1146     Patient presents with: Bleeding/Bruising and redness   Roberto Buchanan is a 23 y.o. male past medical history significant for previous history of suicide attempt, otherwise overall unremarkable past medical history who presents with concern for bruising, redness to left shoulder.  He reports he was donating plasma this week 4 days ago.  He has had some progressive bruising, tenderness since then.  He denies taking his temperature at home but reports he was feeling hot, sweaty last night.   HPI     Prior to Admission medications   Medication Sig Start Date End Date Taking? Authorizing Provider  EPINEPHrine  (PRIMATENE  MIST) 0.125 MG/ACT AERO Inhale 1-2 puffs into the lungs as needed (wheezing/SOB).    [provider]  naproxen  (NAPROSYN ) 500 MG tablet Take 1 tablet (500 mg total) by mouth 2 (two) times daily. 06/24/23   Charlyn Sora, MD    Allergies: Other    Review of Systems  All other systems reviewed and are negative.   Updated Vital Signs BP (!) 134/93 (BP Location: Right Arm)   Pulse 77   Temp 98.2 F (36.8 C) (Oral)   Resp 18   Ht 6' 4 (1.93 m)   Wt 92.5 kg   SpO2 100%   BMI 24.83 kg/m   Physical Exam Vitals and nursing note reviewed.  Constitutional:      General: He is not in acute distress.    Appearance: Normal appearance.  HENT:     Head: Normocephalic and atraumatic.  Eyes:     General:        Right eye: No discharge.        Left eye: No discharge.  Cardiovascular:     Rate and Rhythm: Normal rate and regular rhythm.     Heart sounds: No murmur heard.    No friction rub. No gallop.  Pulmonary:     Effort: Pulmonary effort is normal.     Breath sounds: Normal breath sounds.  Abdominal:     General: Bowel sounds are normal.     Palpations: Abdomen is soft.  Skin:    General: Skin is warm and dry.      Capillary Refill: Capillary refill takes less than 2 seconds.     Comments: See photo.  Irregular pattern of bruising, tenderness to left shoulder, tender to palpation throughout.  Neurological:     Mental Status: He is alert and oriented to person, place, and time.  Psychiatric:        Mood and Affect: Mood normal.        Behavior: Behavior normal.      (all labs ordered are listed, but only abnormal results are displayed) Labs Reviewed  CBC  BASIC METABOLIC PANEL WITH GFR  PROTIME-INR  APTT    EKG: None  Radiology: DG Shoulder Left Result Date: 10/28/2023 CLINICAL DATA:  Bruising and redness after donating plasma EXAM: LEFT SHOULDER - 2+ VIEW COMPARISON:  08/21/2014 FINDINGS: Visualized portion of the left hemithorax is normal. No acute fracture or dislocation. IMPRESSION: Negative. Electronically Signed   By: Roberto Buchanan M.D.   On: 10/28/2023 14:27     Procedures   Medications Ordered in the ED - No data to display  Medical Decision Making Amount and/or Complexity of Data Reviewed Labs: ordered. Radiology: ordered.   This patient is a 23 y.o. male who presents to the ED for concern of rash of upper arm, brusiing.   Differential diagnoses prior to evaluation: Platelet dysfunction, infection, thrombophlebitis, other vasculopathy  Past Medical History / Social History / Additional history: Chart reviewed. Pertinent results include: Overall noncontributory  Physical Exam: Physical exam performed. The pertinent findings include: See photo.  Irregular pattern of bruising, tenderness to left shoulder, tender to palpation throughout.   CBC unremarkable, BMP unremarkable, PT/INR, APTT are all normal.  Plain film x-ray independently interpreted by myself the left shoulder with no evidence of acute fracture, dislocation, soft tissue air, or other abnormality.  Medications / Treatment: Although the presentation is somewhat abnormal  suspect the patient has thrombophlebitis, encouraged conservative treatment with warm compress, watchful waiting, extensive return precautions given for significant worsening of bruising, swelling, pain, or development of fever   Disposition: After consideration of the diagnostic results and the patients response to treatment, I feel that patient is stable for discharge with plan as above.   emergency department workup does not suggest an emergent condition requiring admission or immediate intervention beyond what has been performed at this time. The plan is: as above. The patient is safe for discharge and has been instructed to return immediately for worsening symptoms, change in symptoms or any other concerns.   Final diagnoses:  Spontaneous bruising  Phlebitis    ED Discharge Orders     None          Rosan Sherlean DEL, [place] 10/28/23 1447    Levander Houston, MD 10/31/23 1145

## 2023-12-03 ENCOUNTER — Encounter (HOSPITAL_COMMUNITY): Payer: Self-pay | Admitting: Emergency Medicine

## 2023-12-03 ENCOUNTER — Other Ambulatory Visit: Payer: Self-pay

## 2023-12-03 ENCOUNTER — Emergency Department (HOSPITAL_COMMUNITY)
Admission: EM | Admit: 2023-12-03 | Discharge: 2023-12-03 | Disposition: A | Discharge status: Home / Self Care | Payer: Self-pay | Attending: Emergency Medicine | Admitting: Emergency Medicine

## 2023-12-03 DIAGNOSIS — R55 Syncope and collapse: Secondary | ICD-10-CM | POA: Insufficient documentation

## 2023-12-03 LAB — CBC
HCT: 47.8 % (ref 39.0–52.0)
Hemoglobin: 16 g/dL (ref 13.0–17.0)
MCH: 28.7 pg (ref 26.0–34.0)
MCHC: 33.5 g/dL (ref 30.0–36.0)
MCV: 85.7 fL (ref 80.0–100.0)
Platelets: 253 K/uL (ref 150–400)
RBC: 5.58 MIL/uL (ref 4.22–5.81)
RDW: 13.2 % (ref 11.5–15.5)
WBC: 5.4 K/uL (ref 4.0–10.5)
nRBC: 0 % (ref 0.0–0.2)

## 2023-12-03 LAB — COMPREHENSIVE METABOLIC PANEL WITH GFR
ALT: 23 U/L (ref 0–44)
AST: 26 U/L (ref 15–41)
Albumin: 4.2 g/dL (ref 3.5–5.0)
Alkaline Phosphatase: 48 U/L (ref 38–126)
Anion gap: 13 (ref 5–15)
BUN: 9 mg/dL (ref 6–20)
CO2: 24 mmol/L (ref 22–32)
Calcium: 9.1 mg/dL (ref 8.9–10.3)
Chloride: 101 mmol/L (ref 98–111)
Creatinine, Ser: 0.89 mg/dL (ref 0.61–1.24)
GFR, Estimated: >60 mL/min (ref >60)
Glucose, Bld: 104 mg/dL — ABNORMAL HIGH (ref 70–99)
Potassium: 4.1 mmol/L (ref 3.5–5.1)
Sodium: 138 mmol/L (ref 135–145)
Total Bilirubin: 0.6 mg/dL (ref 0.0–1.2)
Total Protein: 6.6 g/dL (ref 6.5–8.1)

## 2023-12-03 LAB — URINALYSIS, ROUTINE W REFLEX MICROSCOPIC
Bilirubin Urine: NEGATIVE
Glucose, UA: NEGATIVE mg/dL
Hgb urine dipstick: NEGATIVE
Ketones, ur: NEGATIVE mg/dL
Leukocytes,Ua: NEGATIVE
Nitrite: NEGATIVE
Protein, ur: NEGATIVE mg/dL
Specific Gravity, Urine: 1.018 (ref 1.005–1.030)
pH: 7 (ref 5.0–8.0)

## 2023-12-03 MED ORDER — SODIUM CHLORIDE 0.9 % IV BOLUS
1000.0000 mL | Freq: Once | INTRAVENOUS | Status: AC
  Administered 2023-12-03: 1000 mL via INTRAVENOUS

## 2023-12-03 NOTE — Discharge Instructions (Addendum)
 Follow-up with cardiology as soon as possible.  Due to unpredictable episodes of syncope, do not drive until you are seen by cardiology and given clearance to drive.  It is also recommended to stop plasma donation until cleared by cardiology.  If symptoms worsen return to ED for further evaluation.

## 2023-12-03 NOTE — ED Provider Notes (Signed)
 [place] EMERGENCY DEPARTMENT AT [facility] Provider Note   CSN: [id number] Arrival date & time: 12/03/23  1553     Patient presents with: Loss of Consciousness   Roberto Buchanan is a 23 y.o. male.  23 year old male presents to the ED with complaints of syncope x 4 weeks.  Patient reports he has had 11 episodes of syncope in the last 4 weeks.  Patient reports these episodes are random and do not correspond with movement, exercise, sitting to standing, or stressful events.  Patient advised last night he had a syncopal episode in the shower with his girlfriend.  His girlfriend reports he was out for 30 minutes and had some snoring respirations.  Patient reports he has been giving plasma for several months now and last donation was 3 days ago.  Only other medical history patient reports is history of ADHD with use of Adderall as needed.  Patient works at [employer] and does not have a very demanding job.  Patient denies any increased stress.  Patient reports his mother has history of heart problems including syncope and MI, she is 38.     Prior to Admission medications   Medication Sig Start Date End Date Taking? Authorizing Provider  EPINEPHrine  (PRIMATENE  MIST) 0.125 MG/ACT AERO Inhale 1-2 puffs into the lungs as needed (wheezing/SOB).    [provider]  naproxen  (NAPROSYN ) 500 MG tablet Take 1 tablet (500 mg total) by mouth 2 (two) times daily. 06/24/23   Charlyn Sora, MD    Allergies: Other    Review of Systems  Neurological:  Positive for syncope. Negative for seizures.  All other systems reviewed and are negative.   Updated Vital Signs BP 125/81 (BP Location: Right Arm)   Pulse 78   Temp 98.8 F (37.1 C)   Resp (!) 21   SpO2 100%   Physical Exam Vitals and nursing note reviewed.  Constitutional:      General: He is not in acute distress.    Appearance: Normal appearance. He is not ill-appearing.  HENT:     Head: Normocephalic and atraumatic.   Eyes:     Extraocular Movements: Extraocular movements intact.     Pupils: Pupils are equal, round, and reactive to light.  Cardiovascular:     Rate and Rhythm: Normal rate.  Pulmonary:     Effort: Pulmonary effort is normal. No respiratory distress.  Abdominal:     Tenderness: There is no guarding.  Musculoskeletal:        General: Normal range of motion.     Cervical back: Normal range of motion.  Skin:    General: Skin is warm and dry.  Neurological:     General: No focal deficit present.     Mental Status: He is alert.  Psychiatric:        Mood and Affect: Mood normal.        Behavior: Behavior normal.     (all labs ordered are listed, but only abnormal results are displayed) Labs Reviewed  COMPREHENSIVE METABOLIC PANEL WITH GFR - Abnormal; Notable for the following components:      Result Value   Glucose, Bld 104 (*)    All other components within normal limits  CBC  URINALYSIS, ROUTINE W REFLEX MICROSCOPIC  CBG MONITORING, ED    EKG: EKG Interpretation Date/Time:  Tuesday December 03 2023 16:12:59 EDT Ventricular Rate:  78 PR Interval:  123 QRS Duration:  99 QT Interval:  375 QTC Calculation: 428 R Axis:  59  Text Interpretation: Sinus rhythm RSR' in V1 or V2, right VCD or RVH Confirmed by Darra Chew [phone]) on 12/03/2023 4:54:31 PM  Radiology: No results found.  Procedures   Medications Ordered in the ED  sodium chloride  0.9 % bolus 1,000 mL (0 mLs Intravenous Stopped 12/03/23 1938)    23 y.o. male presents to the ED for concern of Loss of Consciousness     This involves an extensive number of treatment options, and is a complaint that carries with it a high risk of complications and morbidity.  The differential diagnosis prior to evaluation includes, but is not limited to: Dysrhythmia, ACS, seizure, vasovagal syncope, anemia  This is not an exhaustive differential.   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  Notably traumatic lumbar hernia managed by [facility]  Lab Tests: I ordered, and personally interpreted labs.  The pertinent results include: No acute abnormalities noted on lab work   Cardiac Monitoring: The patient was maintained on a cardiac monitor.  I personally viewed and interpreted the cardiac monitored which showed an underlying rhythm of: Sinus rhythm  ED Course / Critical Interventions: Pt well-appearing on exam sitting comfortably in ED bed.  Patient currently has no complaints and is concern for repeated symptoms of syncope.  It was discussed with patient that excessive donation of plasma may be correlated to syncopal episode but is also important to follow-up with cardiology for potential dysrhythmias.  With patient's history does not seem seizure-like.  Patient really has not had complaints on record auscultation all fields vitals are stable and no deficits on neuroexam.  Upon reevaluation, patient is still sitting comfortably in ED bed.  It was discussed with patient and advised not to drive until he has been cleared by cardiology due to unpredictable nature of syncope.  All lab work came back negative for any acute abnormalities.  Patient is agreeable with treatment plan and agrees to follow-up with cardiology for syncopal episodes. I have reviewed the patients home medicines and have made adjustments as needed.  Disposition: Considered admission and after reviewing the patient's encounter today, I feel that the patient would benefit from discharge and cardiology follow up.  Discussed course of treatment with the patient, whom demonstrated understanding.  Patient in agreement and has no further questions.    I discussed this case with my attending, Dr. Darra, who agreed with the proposed treatment course and cosigned this note  including patient's presenting symptoms, physical exam, and planned diagnostics and interventions.  Attending physician stated agreement with plan or made changes to plan which were implemented.     This chart was dictated using voice recognition software.  Despite best efforts to proofread, errors can occur which can change the documentation meaning.   Final diagnoses:  Syncope and collapse    ED Discharge Orders          Ordered    Ambulatory referral to Cardiology       Comments: If you have not heard from the Cardiology office within the next 72 hours please call [phone].   12/03/23 1732               Myriam Chew RAMAN, PA-C 12/03/23 2326    Long, Chew MATSU, MD 12/07/23 [phone]

## 2023-12-03 NOTE — ED Triage Notes (Signed)
 Patient presents due to syncopal episodes in which he believes lasts for about 30 mins. He has had 11 episodes since July. He does not know what is bringing them on, but has has noticed warning signs such as dizziness and seeing stars. He has been seen for this before. He donates plasma.
